# Patient Record
Sex: Male | Born: 1955 | Race: White | Hispanic: No | Marital: Married | Smoking: Never smoker
Health system: Southern US, Community
[De-identification: ages and names within clinical notes are randomized; demographics above are authoritative.]

## PROBLEM LIST (undated history)

## (undated) DIAGNOSIS — E213 Hyperparathyroidism, unspecified: Secondary | ICD-10-CM

## (undated) DIAGNOSIS — L719 Rosacea, unspecified: Secondary | ICD-10-CM

## (undated) DIAGNOSIS — J180 Bronchopneumonia, unspecified organism: Secondary | ICD-10-CM

## (undated) DIAGNOSIS — R972 Elevated prostate specific antigen [PSA]: Secondary | ICD-10-CM

## (undated) DIAGNOSIS — K219 Gastro-esophageal reflux disease without esophagitis: Secondary | ICD-10-CM

## (undated) DIAGNOSIS — M1990 Unspecified osteoarthritis, unspecified site: Secondary | ICD-10-CM

## (undated) DIAGNOSIS — M8580 Other specified disorders of bone density and structure, unspecified site: Secondary | ICD-10-CM

## (undated) DIAGNOSIS — M109 Gout, unspecified: Secondary | ICD-10-CM

## (undated) DIAGNOSIS — E7800 Pure hypercholesterolemia, unspecified: Secondary | ICD-10-CM

## (undated) DIAGNOSIS — J309 Allergic rhinitis, unspecified: Secondary | ICD-10-CM

## (undated) DIAGNOSIS — Z8739 Personal history of other diseases of the musculoskeletal system and connective tissue: Secondary | ICD-10-CM

## (undated) HISTORY — PX: FINGER SURGERY: SHX640

## (undated) HISTORY — DX: Other specified disorders of bone density and structure, unspecified site: M85.80

## (undated) HISTORY — DX: Pure hypercholesterolemia, unspecified: E78.00

## (undated) HISTORY — DX: Hypercalcemia: E83.52

## (undated) HISTORY — DX: Rosacea, unspecified: L71.9

## (undated) HISTORY — DX: Allergic rhinitis, unspecified: J30.9

## (undated) HISTORY — DX: Elevated prostate specific antigen (PSA): R97.20

## (undated) HISTORY — PX: TONSILLECTOMY: SUR1361

## (undated) HISTORY — DX: Gastro-esophageal reflux disease without esophagitis: K21.9

## (undated) HISTORY — PX: ESOPHAGOGASTRODUODENOSCOPY: SHX1529

---

## 2003-09-22 ENCOUNTER — Encounter: Admission: RE | Admit: 2003-09-22 | Discharge: 2003-09-22 | Payer: Self-pay | Admitting: Family Medicine

## 2004-02-23 ENCOUNTER — Encounter: Admission: RE | Admit: 2004-02-23 | Discharge: 2004-02-23 | Payer: Self-pay | Admitting: Family Medicine

## 2008-10-03 ENCOUNTER — Emergency Department (HOSPITAL_COMMUNITY): Admission: EM | Admit: 2008-10-03 | Discharge: 2008-10-03 | Payer: Self-pay | Admitting: Emergency Medicine

## 2012-10-10 HISTORY — PX: COLONOSCOPY W/ BIOPSIES AND POLYPECTOMY: SHX1376

## 2016-01-26 DIAGNOSIS — Z0000 Encounter for general adult medical examination without abnormal findings: Secondary | ICD-10-CM | POA: Diagnosis not present

## 2016-01-26 DIAGNOSIS — F418 Other specified anxiety disorders: Secondary | ICD-10-CM | POA: Diagnosis not present

## 2016-01-26 DIAGNOSIS — E559 Vitamin D deficiency, unspecified: Secondary | ICD-10-CM | POA: Diagnosis not present

## 2016-01-26 DIAGNOSIS — E782 Mixed hyperlipidemia: Secondary | ICD-10-CM | POA: Diagnosis not present

## 2016-01-26 DIAGNOSIS — K219 Gastro-esophageal reflux disease without esophagitis: Secondary | ICD-10-CM | POA: Diagnosis not present

## 2016-01-26 DIAGNOSIS — R972 Elevated prostate specific antigen [PSA]: Secondary | ICD-10-CM | POA: Diagnosis not present

## 2016-03-14 DIAGNOSIS — E559 Vitamin D deficiency, unspecified: Secondary | ICD-10-CM | POA: Diagnosis not present

## 2016-03-14 DIAGNOSIS — M8589 Other specified disorders of bone density and structure, multiple sites: Secondary | ICD-10-CM | POA: Diagnosis not present

## 2016-03-28 DIAGNOSIS — E782 Mixed hyperlipidemia: Secondary | ICD-10-CM | POA: Diagnosis not present

## 2016-04-08 DIAGNOSIS — M109 Gout, unspecified: Secondary | ICD-10-CM | POA: Diagnosis not present

## 2016-05-06 ENCOUNTER — Ambulatory Visit (INDEPENDENT_AMBULATORY_CARE_PROVIDER_SITE_OTHER): Payer: BLUE CROSS/BLUE SHIELD | Admitting: Endocrinology

## 2016-05-06 ENCOUNTER — Encounter: Payer: Self-pay | Admitting: Endocrinology

## 2016-05-06 DIAGNOSIS — E7800 Pure hypercholesterolemia, unspecified: Secondary | ICD-10-CM | POA: Diagnosis not present

## 2016-05-06 DIAGNOSIS — M8580 Other specified disorders of bone density and structure, unspecified site: Secondary | ICD-10-CM

## 2016-05-06 DIAGNOSIS — L719 Rosacea, unspecified: Secondary | ICD-10-CM

## 2016-05-06 DIAGNOSIS — K219 Gastro-esophageal reflux disease without esophagitis: Secondary | ICD-10-CM

## 2016-05-06 DIAGNOSIS — J309 Allergic rhinitis, unspecified: Secondary | ICD-10-CM

## 2016-05-06 DIAGNOSIS — R972 Elevated prostate specific antigen [PSA]: Secondary | ICD-10-CM | POA: Diagnosis not present

## 2016-05-06 LAB — TSH: TSH: 1.6 u[IU]/mL (ref 0.35–4.50)

## 2016-05-06 LAB — MAGNESIUM: Magnesium: 2 mg/dL (ref 1.5–2.5)

## 2016-05-06 LAB — PHOSPHORUS: Phosphorus: 2.7 mg/dL (ref 2.3–4.6)

## 2016-05-06 NOTE — Progress Notes (Signed)
Subjective:    Patient ID: Daniel Wong, male    DOB: 1956-09-14, 60 y.o.   MRN: [id number]  HPI Pt was noted to have moderate hypercalcemia in 2016 (he believes it was it was normal prior to that, but not sure). He has never had urolithiasis, thyroid probs, parathyroid probs, sarcoidosis, cancer, PUD, pancreatitis, recent severe injury, or depression.  He does not take vitamin-A supplements.  Pt has no h/o any of these: [place], or prolonged immobilization.  Pt denies taking antacids, Li++, or HCTZ.   Vit-d deficiency was corrected, but Ca++ level was not significantly changed.  He has had slight fx of a few fingers.  No assoc polyuria.   Past Medical History:  Diagnosis Date  . Allergic rhinitis   . GERD (gastroesophageal reflux disease)   . High cholesterol   . Hypercalcemia   . Osteopenia   . PSA elevation   . Rosacea     No past surgical history on file.  Social History   Social History  . Marital status: Single    Spouse name: N/A  . Number of children: N/A  . Years of education: N/A   Occupational History  . Not on file.   Social History Main Topics  . Smoking status: Never Smoker  . Smokeless tobacco: Never Used  . Alcohol use 0.6 oz/week    1 Glasses of wine per week     Comment: 1 glass per night   . Drug use: Unknown  . Sexual activity: Not on file   Other Topics Concern  . Not on file   Social History Narrative  . No narrative on file    No current outpatient prescriptions on file prior to visit.   No current facility-administered medications on file prior to visit.     Allergies  Allergen Reactions  . Sulfa Antibiotics Hives    Family History  Problem Relation Age of Onset  . Multiple myeloma Mother   . Prostate cancer Father     BP 110/80   Pulse 61   Ht 6' 1"  (1.854 m)   Wt 204 lb (92.5 kg)   SpO2 97%   BMI 26.91 kg/m   Review of Systems denies weight loss, gynecomastia, hematuria, cold intolerance, numbness, arthralgias,  abdominal pain, urinary frequency, skin rash, visual loss, sob, diarrhea, rhinorrhea, and easy bruising.      Objective:   Physical Exam VS: see vs page GEN: no distress HEAD: head: no deformity eyes: no periorbital swelling, no proptosis external nose and ears are normal mouth: no lesion seen NECK: supple, thyroid is not enlarged CHEST WALL: no deformity.  No kyphosis LUNGS: clear to auscultation BREASTS:  No gynecomastia CV: reg rate and rhythm, no murmur ABD: abdomen is soft, nontender.  no hepatosplenomegaly.  not distended.  no hernia MUSCULOSKELETAL: muscle bulk and strength are grossly normal.  no obvious joint swelling.  gait is normal and steady EXTEMITIES: no deformity.  no ulcer on the feet.  feet are of normal color and temp.  no edema PULSES: dorsalis pedis intact bilat.  no carotid bruit NEURO:  cn 2-12 grossly intact.   readily moves all 4's.  sensation is intact to touch on the feet SKIN:  Normal texture and temperature.  No rash or suspicious lesion is visible.   NODES:  None palpable at the neck PSYCH: alert, well-oriented.  Does not appear anxious nor depressed.  I have reviewed outside records, and summarized: Pt was followed over 2016 and 2017  for hypercalcemia.  Vit-D deficiency responded to rx.  No cause of hypercalcemia was found.   outside test results are reviewed: Ca++=11.2, 10.7, and 11.1 PTH=65, 43, 42, and 37 AP=81 25-OH vit-D (after replacement)=45  DEXA: T-score is 0.5 and -1.2    Assessment & Plan:  Hypercalcemia, new, uncertain etiology.    Patient is advised the following: Patient Instructions  blood tests and a 24-HR urine test are requested for you today.  We'll let you know about the results.   Renato Shin, MD

## 2016-05-06 NOTE — Patient Instructions (Signed)
blood tests (and a 24-HR urine test) are requested for you today.  We'll let you know about the results.   

## 2016-05-08 ENCOUNTER — Encounter: Payer: Self-pay | Admitting: Endocrinology

## 2016-05-09 NOTE — Addendum Note (Signed)
Addended by: Adline Mango I on: 05/09/2016 11:40 AM   Modules accepted: Orders

## 2016-05-10 LAB — CALCIUM, URINE, 24 HOUR
Calcium, 24 hour urine: 182 mg/24 h (ref 55–300)
Calcium, Ur: 6 mg/dL

## 2016-05-11 LAB — PROTEIN ELECTROPHORESIS, SERUM
Albumin ELP: 4 g/dL (ref 3.8–4.8)
Alpha-1-Globulin: 0.3 g/dL (ref 0.2–0.3)
Alpha-2-Globulin: 0.6 g/dL (ref 0.5–0.9)
Beta 2: 0.3 g/dL (ref 0.2–0.5)
Beta Globulin: 0.4 g/dL (ref 0.4–0.6)
Gamma Globulin: 0.8 g/dL (ref 0.8–1.7)
Total Protein, Serum Electrophoresis: 6.3 g/dL (ref 6.1–8.1)

## 2016-05-11 LAB — VITAMIN D 1,25 DIHYDROXY
Vitamin D 1, 25 (OH)2 Total: 57 pg/mL (ref 18–72)
Vitamin D2 1, 25 (OH)2: <8 pg/mL
Vitamin D3 1, 25 (OH)2: 57 pg/mL

## 2016-05-11 LAB — VITAMIN A: Vitamin A (Retinoic Acid): 64 ug/dL (ref 38–98)

## 2016-05-12 LAB — PTH-RELATED PEPTIDE: PTH-Related Protein (PTH-RP): 15 pg/mL (ref 14–27)

## 2016-07-01 DIAGNOSIS — Z23 Encounter for immunization: Secondary | ICD-10-CM | POA: Diagnosis not present

## 2016-10-24 DIAGNOSIS — J069 Acute upper respiratory infection, unspecified: Secondary | ICD-10-CM | POA: Diagnosis not present

## 2017-02-06 DIAGNOSIS — M21962 Unspecified acquired deformity of left lower leg: Secondary | ICD-10-CM | POA: Diagnosis not present

## 2017-02-06 DIAGNOSIS — M21611 Bunion of right foot: Secondary | ICD-10-CM | POA: Diagnosis not present

## 2017-02-06 DIAGNOSIS — M2041 Other hammer toe(s) (acquired), right foot: Secondary | ICD-10-CM | POA: Diagnosis not present

## 2017-02-06 DIAGNOSIS — M21961 Unspecified acquired deformity of right lower leg: Secondary | ICD-10-CM | POA: Diagnosis not present

## 2017-02-06 DIAGNOSIS — M2042 Other hammer toe(s) (acquired), left foot: Secondary | ICD-10-CM | POA: Diagnosis not present

## 2017-02-06 DIAGNOSIS — M21612 Bunion of left foot: Secondary | ICD-10-CM | POA: Diagnosis not present

## 2017-03-27 DIAGNOSIS — Z125 Encounter for screening for malignant neoplasm of prostate: Secondary | ICD-10-CM | POA: Diagnosis not present

## 2017-03-27 DIAGNOSIS — Z1159 Encounter for screening for other viral diseases: Secondary | ICD-10-CM | POA: Diagnosis not present

## 2017-03-27 DIAGNOSIS — M109 Gout, unspecified: Secondary | ICD-10-CM | POA: Diagnosis not present

## 2017-03-27 DIAGNOSIS — E782 Mixed hyperlipidemia: Secondary | ICD-10-CM | POA: Diagnosis not present

## 2017-03-27 DIAGNOSIS — E559 Vitamin D deficiency, unspecified: Secondary | ICD-10-CM | POA: Diagnosis not present

## 2017-03-27 DIAGNOSIS — Z0000 Encounter for general adult medical examination without abnormal findings: Secondary | ICD-10-CM | POA: Diagnosis not present

## 2017-07-05 DIAGNOSIS — Z23 Encounter for immunization: Secondary | ICD-10-CM | POA: Diagnosis not present

## 2017-09-04 DIAGNOSIS — H04121 Dry eye syndrome of right lacrimal gland: Secondary | ICD-10-CM | POA: Diagnosis not present

## 2018-01-02 DIAGNOSIS — J069 Acute upper respiratory infection, unspecified: Secondary | ICD-10-CM | POA: Diagnosis not present

## 2018-01-02 DIAGNOSIS — J040 Acute laryngitis: Secondary | ICD-10-CM | POA: Diagnosis not present

## 2018-02-22 DIAGNOSIS — M25511 Pain in right shoulder: Secondary | ICD-10-CM | POA: Diagnosis not present

## 2018-04-09 DIAGNOSIS — Z0000 Encounter for general adult medical examination without abnormal findings: Secondary | ICD-10-CM | POA: Diagnosis not present

## 2018-04-09 DIAGNOSIS — Z125 Encounter for screening for malignant neoplasm of prostate: Secondary | ICD-10-CM | POA: Diagnosis not present

## 2018-04-09 DIAGNOSIS — R351 Nocturia: Secondary | ICD-10-CM | POA: Diagnosis not present

## 2018-04-09 DIAGNOSIS — R972 Elevated prostate specific antigen [PSA]: Secondary | ICD-10-CM | POA: Diagnosis not present

## 2018-04-09 DIAGNOSIS — E559 Vitamin D deficiency, unspecified: Secondary | ICD-10-CM | POA: Diagnosis not present

## 2018-04-09 DIAGNOSIS — E782 Mixed hyperlipidemia: Secondary | ICD-10-CM | POA: Diagnosis not present

## 2018-05-02 ENCOUNTER — Other Ambulatory Visit: Payer: Self-pay

## 2018-05-02 ENCOUNTER — Encounter (HOSPITAL_COMMUNITY): Payer: Self-pay | Admitting: *Deleted

## 2018-05-02 ENCOUNTER — Inpatient Hospital Stay (HOSPITAL_COMMUNITY): Payer: BLUE CROSS/BLUE SHIELD

## 2018-05-02 ENCOUNTER — Encounter (HOSPITAL_COMMUNITY)
Admission: EM | Disposition: A | Discharge status: Home / Self Care | Payer: Self-pay | Source: Home / Self Care | Attending: Emergency Medicine

## 2018-05-02 ENCOUNTER — Observation Stay (HOSPITAL_COMMUNITY)
Admission: EM | Admit: 2018-05-02 | Discharge: 2018-05-03 | Disposition: A | Discharge status: Home / Self Care | Payer: BLUE CROSS/BLUE SHIELD | Attending: Cardiovascular Disease | Admitting: Cardiovascular Disease

## 2018-05-02 DIAGNOSIS — E7800 Pure hypercholesterolemia, unspecified: Secondary | ICD-10-CM | POA: Insufficient documentation

## 2018-05-02 DIAGNOSIS — Z79899 Other long term (current) drug therapy: Secondary | ICD-10-CM | POA: Diagnosis not present

## 2018-05-02 DIAGNOSIS — Z9889 Other specified postprocedural states: Secondary | ICD-10-CM | POA: Insufficient documentation

## 2018-05-02 DIAGNOSIS — I061 Rheumatic aortic insufficiency: Secondary | ICD-10-CM | POA: Diagnosis not present

## 2018-05-02 DIAGNOSIS — Z7982 Long term (current) use of aspirin: Secondary | ICD-10-CM | POA: Insufficient documentation

## 2018-05-02 DIAGNOSIS — M8580 Other specified disorders of bone density and structure, unspecified site: Secondary | ICD-10-CM | POA: Insufficient documentation

## 2018-05-02 DIAGNOSIS — K219 Gastro-esophageal reflux disease without esophagitis: Secondary | ICD-10-CM | POA: Diagnosis not present

## 2018-05-02 DIAGNOSIS — R9431 Abnormal electrocardiogram [ECG] [EKG]: Secondary | ICD-10-CM | POA: Diagnosis not present

## 2018-05-02 DIAGNOSIS — I25119 Atherosclerotic heart disease of native coronary artery with unspecified angina pectoris: Secondary | ICD-10-CM | POA: Diagnosis not present

## 2018-05-02 DIAGNOSIS — I249 Acute ischemic heart disease, unspecified: Secondary | ICD-10-CM

## 2018-05-02 DIAGNOSIS — R079 Chest pain, unspecified: Secondary | ICD-10-CM | POA: Diagnosis present

## 2018-05-02 DIAGNOSIS — Z882 Allergy status to sulfonamides status: Secondary | ICD-10-CM | POA: Diagnosis not present

## 2018-05-02 DIAGNOSIS — R0789 Other chest pain: Secondary | ICD-10-CM | POA: Diagnosis not present

## 2018-05-02 DIAGNOSIS — R072 Precordial pain: Secondary | ICD-10-CM

## 2018-05-02 HISTORY — DX: Personal history of other diseases of the musculoskeletal system and connective tissue: Z87.39

## 2018-05-02 HISTORY — DX: Unspecified osteoarthritis, unspecified site: M19.90

## 2018-05-02 HISTORY — PX: CARDIAC CATHETERIZATION: SHX172

## 2018-05-02 HISTORY — PX: LEFT HEART CATH AND CORONARY ANGIOGRAPHY: CATH118249

## 2018-05-02 HISTORY — DX: Bronchopneumonia, unspecified organism: J18.0

## 2018-05-02 LAB — LIPID PANEL
CHOLESTEROL: 154 mg/dL (ref 0–200)
HDL: 52 mg/dL (ref >40)
LDL Cholesterol: 87 mg/dL (ref 0–99)
Total CHOL/HDL Ratio: 3 RATIO
Triglycerides: 75 mg/dL (ref <150)
VLDL: 15 mg/dL (ref 0–40)

## 2018-05-02 LAB — CBC
HCT: 47.2 % (ref 39.0–52.0)
HEMATOCRIT: 39.9 % (ref 39.0–52.0)
HEMOGLOBIN: 13.2 g/dL (ref 13.0–17.0)
Hemoglobin: 15.1 g/dL (ref 13.0–17.0)
MCH: 30 pg (ref 26.0–34.0)
MCH: 29.9 pg (ref 26.0–34.0)
MCHC: 32 g/dL (ref 30.0–36.0)
MCHC: 33.1 g/dL (ref 30.0–36.0)
MCV: 93.7 fL (ref 78.0–100.0)
MCV: 90.3 fL (ref 78.0–100.0)
Platelets: 240 10*3/uL (ref 150–400)
Platelets: 216 10*3/uL (ref 150–400)
RBC: 5.04 MIL/uL (ref 4.22–5.81)
RBC: 4.42 MIL/uL (ref 4.22–5.81)
RDW: 12.5 % (ref 11.5–15.5)
RDW: 12.4 % (ref 11.5–15.5)
WBC: 10.6 10*3/uL — ABNORMAL HIGH (ref 4.0–10.5)
WBC: 12.3 10*3/uL — AB (ref 4.0–10.5)

## 2018-05-02 LAB — BASIC METABOLIC PANEL
Anion gap: 6 (ref 5–15)
BUN: 12 mg/dL (ref 8–23)
CO2: 27 mmol/L (ref 22–32)
Calcium: 10.7 mg/dL — ABNORMAL HIGH (ref 8.9–10.3)
Chloride: 107 mmol/L (ref 98–111)
Creatinine, Ser: 1 mg/dL (ref 0.61–1.24)
GFR calc Af Amer: >60 mL/min (ref >60)
GFR calc non Af Amer: >60 mL/min (ref >60)
Glucose, Bld: 179 mg/dL — ABNORMAL HIGH (ref 70–99)
Potassium: 4.1 mmol/L (ref 3.5–5.1)
Sodium: 140 mmol/L (ref 135–145)

## 2018-05-02 LAB — COMPREHENSIVE METABOLIC PANEL
ALT: 34 U/L (ref 0–44)
ANION GAP: 6 (ref 5–15)
AST: 31 U/L (ref 15–41)
Albumin: 3.5 g/dL (ref 3.5–5.0)
Alkaline Phosphatase: 72 U/L (ref 38–126)
BUN: 11 mg/dL (ref 8–23)
CO2: 25 mmol/L (ref 22–32)
CREATININE: 0.93 mg/dL (ref 0.61–1.24)
Calcium: 9.9 mg/dL (ref 8.9–10.3)
Chloride: 107 mmol/L (ref 98–111)
GFR calc non Af Amer: >60 mL/min (ref >60)
Glucose, Bld: 136 mg/dL — ABNORMAL HIGH (ref 70–99)
POTASSIUM: 3.9 mmol/L (ref 3.5–5.1)
Sodium: 138 mmol/L (ref 135–145)
TOTAL PROTEIN: 5.6 g/dL — AB (ref 6.5–8.1)
Total Bilirubin: 0.6 mg/dL (ref 0.3–1.2)

## 2018-05-02 LAB — TROPONIN I: Troponin I: <0.03 ng/mL (ref <0.03)

## 2018-05-02 LAB — APTT

## 2018-05-02 LAB — SEDIMENTATION RATE: SED RATE: 3 mm/h (ref 0–16)

## 2018-05-02 LAB — HEMOGLOBIN A1C
Hgb A1c MFr Bld: 5.3 % (ref 4.8–5.6)
Mean Plasma Glucose: 105.41 mg/dL

## 2018-05-02 LAB — I-STAT TROPONIN, ED: Troponin i, poc: 0 ng/mL (ref 0.00–0.08)

## 2018-05-02 LAB — PROTIME-INR
INR: 1.22
Prothrombin Time: 15.3 seconds — ABNORMAL HIGH (ref 11.4–15.2)

## 2018-05-02 SURGERY — LEFT HEART CATH AND CORONARY ANGIOGRAPHY
Anesthesia: LOCAL

## 2018-05-02 MED ORDER — SODIUM CHLORIDE 0.9% FLUSH
3.0000 mL | Freq: Two times a day (BID) | INTRAVENOUS | Status: DC
  Administered 2018-05-03: 10:00:00 3 mL via INTRAVENOUS

## 2018-05-02 MED ORDER — ATORVASTATIN CALCIUM 40 MG PO TABS
40.0000 mg | ORAL_TABLET | Freq: Every day | ORAL | Status: DC
  Administered 2018-05-02: 18:00:00 40 mg via ORAL
  Filled 2018-05-02: qty 1

## 2018-05-02 MED ORDER — FENTANYL CITRATE (PF) 100 MCG/2ML IJ SOLN
INTRAMUSCULAR | Status: AC
  Filled 2018-05-02: qty 2

## 2018-05-02 MED ORDER — HEPARIN (PORCINE) IN NACL 1000-0.9 UT/500ML-% IV SOLN
INTRAVENOUS | Status: DC | PRN
  Administered 2018-05-02: 500 mL

## 2018-05-02 MED ORDER — FENTANYL CITRATE (PF) 100 MCG/2ML IJ SOLN
INTRAMUSCULAR | Status: DC | PRN
  Administered 2018-05-02 (×2): 25 ug via INTRAVENOUS

## 2018-05-02 MED ORDER — HEPARIN (PORCINE) IN NACL 1000-0.9 UT/500ML-% IV SOLN
INTRAVENOUS | Status: DC | PRN
  Administered 2018-05-02 (×2): 500 mL

## 2018-05-02 MED ORDER — MIDAZOLAM HCL 2 MG/2ML IJ SOLN
INTRAMUSCULAR | Status: DC | PRN
  Administered 2018-05-02: 2 mg via INTRAVENOUS
  Administered 2018-05-02: 1 mg via INTRAVENOUS

## 2018-05-02 MED ORDER — SODIUM CHLORIDE 0.9 % IV SOLN: 250.0000 mL | INTRAVENOUS | Status: DC | PRN

## 2018-05-02 MED ORDER — IOPAMIDOL (ISOVUE-370) INJECTION 76%
INTRAVENOUS | Status: DC | PRN
  Administered 2018-05-02: 90 mL via INTRA_ARTERIAL

## 2018-05-02 MED ORDER — VERAPAMIL HCL 2.5 MG/ML IV SOLN
INTRAVENOUS | Status: DC | PRN
  Administered 2018-05-02: 10 mL via INTRA_ARTERIAL

## 2018-05-02 MED ORDER — LIDOCAINE HCL (PF) 1 % IJ SOLN
INTRAMUSCULAR | Status: AC
  Filled 2018-05-02: qty 30

## 2018-05-02 MED ORDER — HEPARIN SODIUM (PORCINE) 1000 UNIT/ML IJ SOLN
INTRAMUSCULAR | Status: DC | PRN
  Administered 2018-05-02: 4000 [IU] via INTRAVENOUS

## 2018-05-02 MED ORDER — SODIUM CHLORIDE 0.9 % IV SOLN
INTRAVENOUS | Status: DC
  Administered 2018-05-02: 17:00:00 via INTRAVENOUS

## 2018-05-02 MED ORDER — HEPARIN SODIUM (PORCINE) 1000 UNIT/ML IJ SOLN
INTRAMUSCULAR | Status: AC
Start: 2018-05-02 — End: ?
  Filled 2018-05-02: qty 1

## 2018-05-02 MED ORDER — MIDAZOLAM HCL 2 MG/2ML IJ SOLN
INTRAMUSCULAR | Status: AC
  Filled 2018-05-02: qty 2

## 2018-05-02 MED ORDER — COLCHICINE 0.6 MG PO TABS: 0.6000 mg | ORAL_TABLET | Freq: Two times a day (BID) | ORAL | Status: DC | PRN

## 2018-05-02 MED ORDER — ACETAMINOPHEN 325 MG PO TABS: 650.0000 mg | ORAL_TABLET | ORAL | Status: DC | PRN

## 2018-05-02 MED ORDER — ASPIRIN 81 MG PO CHEW
CHEWABLE_TABLET | ORAL | Status: DC | PRN
  Administered 2018-05-02: 162 mg via ORAL

## 2018-05-02 MED ORDER — PANTOPRAZOLE SODIUM 20 MG PO TBEC
20.0000 mg | DELAYED_RELEASE_TABLET | Freq: Two times a day (BID) | ORAL | Status: DC
Start: 2018-05-02 — End: 2018-05-03
  Filled 2018-05-02: qty 1

## 2018-05-02 MED ORDER — HEPARIN (PORCINE) IN NACL 1000-0.9 UT/500ML-% IV SOLN
INTRAVENOUS | Status: AC
  Filled 2018-05-02: qty 1000

## 2018-05-02 MED ORDER — SODIUM CHLORIDE 0.9% FLUSH: 3.0000 mL | INTRAVENOUS | Status: DC | PRN

## 2018-05-02 MED ORDER — ZOLPIDEM TARTRATE 5 MG PO TABS: 5.0000 mg | ORAL_TABLET | Freq: Every evening | ORAL | Status: DC | PRN

## 2018-05-02 MED ORDER — LIDOCAINE HCL (PF) 1 % IJ SOLN
INTRAMUSCULAR | Status: DC | PRN
  Administered 2018-05-02: 2 mL via SUBCUTANEOUS

## 2018-05-02 MED ORDER — ASPIRIN 81 MG PO CHEW
81.0000 mg | CHEWABLE_TABLET | Freq: Every day | ORAL | Status: DC
  Administered 2018-05-03: 10:00:00 81 mg via ORAL
  Filled 2018-05-02: qty 1

## 2018-05-02 MED ORDER — IOPAMIDOL (ISOVUE-370) INJECTION 76%
INTRAVENOUS | Status: AC
  Filled 2018-05-02: qty 125

## 2018-05-02 MED ORDER — VERAPAMIL HCL 2.5 MG/ML IV SOLN
INTRAVENOUS | Status: AC
  Filled 2018-05-02: qty 2

## 2018-05-02 MED ORDER — DIAZEPAM 5 MG PO TABS: 5.0000 mg | ORAL_TABLET | Freq: Four times a day (QID) | ORAL | Status: DC | PRN

## 2018-05-02 MED ORDER — ONDANSETRON HCL 4 MG/2ML IJ SOLN
4.0000 mg | Freq: Four times a day (QID) | INTRAMUSCULAR | Status: DC | PRN
Start: 2018-05-02 — End: 2018-05-03

## 2018-05-02 SURGICAL SUPPLY — 12 items
CATH INFINITI 5FR ANG PIGTAIL (CATHETERS) ×2 IMPLANT
CATH OPTITORQUE TIG 4.0 5F (CATHETERS) ×2 IMPLANT
DEVICE RAD COMP TR BAND LRG (VASCULAR PRODUCTS) ×2 IMPLANT
GLIDESHEATH SLEND SS 6F .021 (SHEATH) ×2 IMPLANT
GUIDEWIRE INQWIRE 1.5J.035X260 (WIRE) ×1 IMPLANT
INQWIRE 1.5J .035X260CM (WIRE) ×2
KIT ENCORE 26 ADVANTAGE (KITS) IMPLANT
KIT HEART LEFT (KITS) ×2 IMPLANT
PACK CARDIAC CATHETERIZATION (CUSTOM PROCEDURE TRAY) ×2 IMPLANT
SYR MEDRAD MARK V 150ML (SYRINGE) ×2 IMPLANT
TRANSDUCER W/STOPCOCK (MISCELLANEOUS) ×2 IMPLANT
TUBING CIL FLEX 10 FLL-RA (TUBING) ×2 IMPLANT

## 2018-05-02 NOTE — ED Triage Notes (Signed)
Pt in c/o chest pain that started this morning, worse with taking a deep breath, denies n/v, no distress noted

## 2018-05-02 NOTE — ED Provider Notes (Signed)
Emergency Department Provider Note   I have reviewed the triage vital signs and the nursing notes.   HISTORY  Chief Complaint Chest Pain   HPI Daniel Wong is a 62 y.o. male with chest pain.  Patient states that he has had worsening chest tightness earlier today.  Did not get better with his normal reflux medication.  He has no nausea, vomiting, shortness of breath, diaphoresis.  No cough.  Does not radiate.  No other associated symptoms.  Never had this before.  No family history of the same. No other associated or modifying symptoms.    Past Medical History:  Diagnosis Date  . Allergic rhinitis   . GERD (gastroesophageal reflux disease)   . High cholesterol   . Hypercalcemia   . Osteopenia   . PSA elevation   . Rosacea     Patient Active Problem List   Diagnosis Date Noted  . Chest pain at rest 05/02/2018  . ACS (acute coronary syndrome) ([place])   . Hypercalcemia 05/06/2016  . Rosacea   . High cholesterol   . PSA elevation   . Allergic rhinitis   . GERD (gastroesophageal reflux disease)   . Osteopenia     History reviewed. No pertinent surgical history.    Allergies Sulfa antibiotics  Family History  Problem Relation Age of Onset  . Multiple myeloma Mother   . Prostate cancer Father   . Hypercalcemia Neg Hx     Social History Social History   Tobacco Use  . Smoking status: Never Smoker  . Smokeless tobacco: Never Used  Substance Use Topics  . Alcohol use: Yes    Alcohol/week: 0.6 oz    Types: 1 Glasses of wine per week    Comment: 1 glass per night   . Drug use: Not on file    Review of Systems  All other systems negative except as documented in the HPI. All pertinent positives and negatives as reviewed in the HPI. ____________________________________________   PHYSICAL EXAM:  VITAL SIGNS: ED Triage Vitals  Enc Vitals Group     BP 05/02/18 1445 128/78     Pulse Rate 05/02/18 1445 77     Resp 05/02/18 1445 20     Temp 05/02/18  1445 98 F (36.7 C)     Temp Source 05/02/18 1445 Oral     SpO2 05/02/18 1445 100 %    Constitutional: Alert and oriented. Well appearing and in no acute distress. Eyes: Conjunctivae are normal. PERRL. EOMI. Head: Atraumatic. Nose: No congestion/rhinnorhea. Mouth/Throat: Mucous membranes are moist.  Oropharynx non-erythematous. Neck: No stridor.  No meningeal signs.   Cardiovascular: Normal rate, regular rhythm. Good peripheral circulation. Grossly normal heart sounds.   Respiratory: Normal respiratory effort.  No retractions. Lungs CTAB. Gastrointestinal: Soft and nontender. No distention.  Musculoskeletal: No lower extremity tenderness nor edema. No gross deformities of extremities. Neurologic:  Normal speech and language. No gross focal neurologic deficits are appreciated.  Skin:  Skin is warm, dry and intact. No rash noted.  ____________________________________________   LABS (all labs ordered are listed, but only abnormal results are displayed)  Labs Reviewed  BASIC METABOLIC PANEL - Abnormal; Notable for the following components:      Result Value   Glucose, Bld 179 (*)    Calcium 10.7 (*)    All other components within normal limits  CBC - Abnormal; Notable for the following components:   WBC 10.6 (*)    All other components within normal limits  CBC - Abnormal; Notable for the following components:   WBC 12.3 (*)    All other components within normal limits  COMPREHENSIVE METABOLIC PANEL  LIPID PANEL  HEMOGLOBIN A1C  PROTIME-INR  APTT  TROPONIN I  SEDIMENTATION RATE  CBC  BASIC METABOLIC PANEL  I-STAT TROPONIN, ED   ____________________________________________  EKG   EKG Interpretation  Date/Time:  Wednesday May 02 2018 14:46:51 EDT Ventricular Rate:  74 PR Interval:  148 QRS Duration: 94 QT Interval:  390 QTC Calculation: 432 R Axis:   39 Text Interpretation:   Critical Test Result: STEMI Normal sinus rhythm ST elevation consider inferior injury  or acute infarct ** ** ACUTE MI / STEMI ** ** Abnormal ECG No old tracing to compare Confirmed by Jorgen Wolfinger, Corene Cornea [phone]) on 05/02/2018 2:53:55 PM        INITIAL IMPRESSION / ASSESSMENT AND PLAN / ED COURSE  EKG with possible STEMI versus more likely to be benign repolarization.  Try to discuss it with cardiology but I think they saw and took him to the Cath Lab instead.  Was not able to reassess patient.  No other interventions or workup done in the emergency room.  Pertinent labs & imaging results that were available during my care of the patient were reviewed by me and considered in my medical decision making (see chart for details).  ____________________________________________  FINAL CLINICAL IMPRESSION(S) / ED DIAGNOSES  Final diagnoses:  Chest pain, unspecified type     MEDICATIONS GIVEN DURING THIS VISIT:  Medications  acetaminophen (TYLENOL) tablet 650 mg (has no administration in time range)  ondansetron (ZOFRAN) injection 4 mg (has no administration in time range)  0.9 %  sodium chloride infusion ( Intravenous New Bag/Given 05/02/18 1701)  sodium chloride flush (NS) 0.9 % injection 3 mL (has no administration in time range)  sodium chloride flush (NS) 0.9 % injection 3 mL (has no administration in time range)  0.9 %  sodium chloride infusion (has no administration in time range)  diazepam (VALIUM) tablet 5 mg (has no administration in time range)  atorvastatin (LIPITOR) tablet 40 mg (has no administration in time range)  aspirin chewable tablet 81 mg (has no administration in time range)  zolpidem (AMBIEN) tablet 5 mg (has no administration in time range)     NEW OUTPATIENT MEDICATIONS STARTED DURING THIS VISIT:  Current Discharge Medication List      Note:  This note was prepared with assistance of Dragon voice recognition software. Occasional wrong-word or sound-a-like substitutions may have occurred due to the inherent limitations of voice recognition software.    Merrily Pew, MD 05/02/18 1721

## 2018-05-02 NOTE — H&P (Addendum)
Cardiology Admission History and Physical:   Patient ID: JAHI ROZA; MRN: [id number]; DOB: 1956/07/08   Admission date: 05/02/2018  Primary Care Provider: Mayra Neer, MD Primary Cardiologist: Shelva Majestic, MD NEW Primary Electrophysiologist:  NA  Chief Complaint:  Chest pain  Patient Profile:   BILLYJOE GO is a 62 y.o. male with a history of GERD, HLD now presenting with chest pain and acute EKG changes. To cath lab emergently.   History of Present Illness:   Mr. Ewings developed chest pain after BK which included a few cups of coffee this AM, he has hx of GERD and took meds for this and with pain he also took pepcid.  When pain did not improve he took 2 baby ASA,  About 11 AM, he does note pain increases with deep breath and no N/V.     EKG with ST elevation in inf. Leads- pt brought to cath lab for emergent STEMI.  Dr. Claiborne Billings has seen and examined pt.     Past Medical History:  Diagnosis Date  . Allergic rhinitis   . GERD (gastroesophageal reflux disease)   . High cholesterol   . Hypercalcemia   . Osteopenia   . PSA elevation   . Rosacea     History reviewed. No pertinent surgical history.   Medications Prior to Admission: Prior to Admission medications   Medication Sig Start Date End Date Taking? Authorizing Provider  aspirin 81 MG tablet Take 81 mg by mouth daily.    [provider]  atorvastatin (LIPITOR) 20 MG tablet Take 20 mg by mouth daily.    [provider]  Azelaic Acid (FINACEA) 15 % cream Apply 15 % topically as needed. After skin is thoroughly washed and patted dry, gently but thoroughly massage a thin film of azelaic acid cream into the affected area twice daily, in the morning and evening.    [provider]  Cholecalciferol (VITAMIN D-3) 1000 units CAPS Take 5,000 Units by mouth daily.    [provider]  colesevelam (WELCHOL) 625 MG tablet Take 625 mg by mouth 2 (two) times daily with a meal.    [provider]  desonide (DESOWEN) 0.05 % cream Apply 1 application topically as needed.    [provider]  doxylamine, Sleep, (UNISOM) 25 MG tablet Take 25 mg by mouth as needed.    [provider]  loratadine (CLARITIN) 10 MG tablet Take 10 mg by mouth daily.    [provider]  Multiple Vitamins-Minerals (MULTIVITAMIN MEN) TABS Take by mouth 2 (two) times daily.    [provider]  naproxen sodium (ANAPROX) 220 MG tablet Take 220 mg by mouth as needed.    [provider]  Pseudoephedrine-Guaifenesin (CONGESTAC) 60-400 MG TABS Take 60 mg by mouth as needed.    [provider]  RABEprazole (ACIPHEX) 20 MG tablet Take 20 mg by mouth daily.    [provider]     Allergies:    Allergies  Allergen Reactions  . Sulfa Antibiotics Hives    Social History:   Social History   Socioeconomic History  . Marital status: Single    Spouse name: Not on file  . Number of children: Not on file  . Years of education: Not on file  . Highest education level: Not on file  Occupational History  . Not on file  Social Needs  . Financial resource strain: Not on file  . Food insecurity:    Worry: Not on  file    Inability: Not on file  . Transportation needs:    Medical: Not on file    Non-medical: Not on file  Tobacco Use  . Smoking status: Never Smoker  . Smokeless tobacco: Never Used  Substance and Sexual Activity  . Alcohol use: Yes    Alcohol/week: 0.6 oz    Types: 1 Glasses of wine per week    Comment: 1 glass per night   . Drug use: Not on file  . Sexual activity: Not on file  Lifestyle  . Physical activity:    Days per week: Not on file    Minutes per session: Not on file  . Stress: Not on file  Relationships  . Social connections:    Talks on phone: Not on file    Gets together: Not on file    Attends religious service: Not on file    Active member of club or organization: Not on file    Attends meetings of clubs  or organizations: Not on file    Relationship status: Not on file  . Intimate partner violence:    Fear of current or ex partner: Not on file    Emotionally abused: Not on file    Physically abused: Not on file    Forced sexual activity: Not on file  Other Topics Concern  . Not on file  Social History Narrative  . Not on file    Family History:   The patient's family history includes Multiple myeloma in his mother; Prostate cancer in his father. There is no history of Hypercalcemia.    ROS:  Please see the history of present illness.  General:no colds or fevers, no weight changes Skin:no rashes or ulcers HEENT:no blurred vision, no congestion CV:see HPI PUL:see HPI GI:no diarrhea constipation or melena, + indigestion GU:no hematuria, no dysuria MS:no joint pain, no claudication Neuro:no syncope, no lightheadedness Endo:no diabetes, no thyroid disease .     Physical Exam/Data:   Vitals:   05/02/18 1445 05/02/18 1546  BP: 128/78   Pulse: 77   Resp: 20   Temp: 98 F (36.7 C)   TempSrc: Oral   SpO2: 100% 100%   No intake or output data in the 24 hours ending 05/02/18 1549 There were no vitals filed for this visit. There is no height or weight on file to calculate BMI.  Per Dr. Claiborne Billings  General:  Well nourished, well developed, in no acute distress HEENT: normal Lymph: no adenopathy Neck: no JVD Endocrine:  No thryomegaly Vascular: No carotid bruits; pedal pulses 2+ bilaterally without bruits  Cardiac:  normal S1, S2; RRR; no murmur gallup rub or click Lungs:  clear to auscultation bilaterally, no wheezing, rhonchi or rales  Abd: soft, nontender, no hepatomegaly  Ext: no edema Musculoskeletal:  No deformities, BUE and BLE strength normal and equal Skin: warm and dry  Neuro:  Alert and oriented X 3 MAE, no focal abnormalities noted Psych:  Normal affect    EKG:  The ECG that was done SR with inf ST elevation was personally reviewed   Relevant CV Studies: Cath   pending  Laboratory Data:  Chemistry Recent Labs  Lab 05/02/18 1449  NA 140  K 4.1  CL 107  CO2 27  GLUCOSE 179*  BUN 12  CREATININE 1.00  CALCIUM 10.7*  GFRNONAA >60  GFRAA >60  ANIONGAP 6    No results for input(s): PROT, ALBUMIN, AST, ALT, ALKPHOS, BILITOT in the last 168 hours. Hematology  Recent Labs  Lab 05/02/18 1449  WBC 10.6*  RBC 5.04  HGB 15.1  HCT 47.2  MCV 93.7  MCH 30.0  MCHC 32.0  RDW 12.5  PLT 240   Cardiac EnzymesNo results for input(s): TROPONINI in the last 168 hours.  Recent Labs  Lab 05/02/18 1500  TROPIPOC 0.00    BNPNo results for input(s): BNP, PROBNP in the last 168 hours.  DDimer No results for input(s): DDIMER in the last 168 hours.  Radiology/Studies:  No results found.  Assessment and Plan:   1. Chest pain with abnormal EKG and STEMI to cath lab  Severity of Illness: The appropriate patient status for this patient is INPATIENT. Inpatient status is judged to be reasonable and necessary in order to provide the required intensity of service to ensure the patient's safety. The patient's presenting symptoms, physical exam findings, and initial radiographic and laboratory data in the context of their chronic comorbidities is felt to place them at high risk for further clinical deterioration. Furthermore, it is not anticipated that the patient will be medically stable for discharge from the hospital within 2 midnights of admission. The following factors support the patient status of inpatient.   " The patient's presenting symptoms include chest pain. " The worrisome physical exam findings include abnormal EKG. " The initial radiographic and laboratory data are worrisome because of abnormality " The chronic co-morbidities include none.   * I certify that at the point of admission it is my clinical judgment that the patient will require inpatient hospital care spanning beyond 2 midnights from the point of admission due to high intensity  of service, high risk for further deterioration and high frequency of surveillance required.*    For questions or updates, please contact [place] Please consult [email or web address] for contact info under Cardiology/STEMI.    Signed, Cecilie Kicks, NP  05/02/2018 3:49 PM   Patient seen and examined. Agree with assessment and plan.  Mr. Antavion Bartoszek is a 62 year old gentleman without known significant cardiac history.  He has a history of hyperlipidemia and has been on atorvastatin 40 mg addition to WelChol.  He also has a history of GERD.  He had never experienced prior chest pain before.  Today [place] 830 this morning he began to notice a sensation of chest pressure.  His symptoms persisted throughout the day.  Ultimately his chest pain seemed to become more intense leading to his emergency room presentation.  While in triage, his initial ECG showed mild J-point elevation inferiorly with suggestion of subtle downsloping PR segment.  A subsequent ECG was concerning for possible early acute inferior injury with 1 mm of inferior ST elevation.  He is brought emergently to the catheterization laboratory for evaluation.  Of note, the patient upon arrival to the Cath Lab still had residual 4 out of 10 chest pain but appeared comfortable.  He had taken 2 baby aspirin earlier today prior to arrival to the emergency room.  I discussed EKG findings with the patient and his wife.  Due to concern for possible early acute inferior injury, plan emergent cardiac catheterization.   Troy Sine, MD, [facility] 05/02/2018 6:24 PM

## 2018-05-02 NOTE — Progress Notes (Signed)
[facility] BAND REMOVAL  LOCATION:    right radial  DEFLATED PER PROTOCOL:    Yes.    TIME BAND OFF / DRESSING APPLIED:    1900    SITE UPON ARRIVAL:    Level 0  SITE AFTER BAND REMOVAL:    Level 0  CIRCULATION SENSATION AND MOVEMENT:    Within Normal Limits   Yes.    COMMENTS:

## 2018-05-02 NOTE — ED Notes (Signed)
Paged CARDS Per Dr Clayborne DanaMesner

## 2018-05-02 NOTE — ED Provider Notes (Addendum)
Patient placed in Quick Look pathway, seen and evaluated   Chief Complaint: chest pain  HPI:   Daniel Wong is a 62 y.o. male who presents to the ED with chest pain that started this morning. Patient reports he got up this morning ate breakfast and drank a few cups of coffee and the pain started after that. Patient took his regular medication for acid reflux this morning and then when pain started to took Pepcid thinking it was heartburn. Then when it didn't stop he took 2 baby ASA about 11 am.  The pain increases with deep breath. Patient denies n/v.   ROS: CV: chest pain  Physical Exam:  BP 128/78 (BP Location: Right Arm)   Pulse 77   Temp 98 F (36.7 C) (Oral)   Resp 20   SpO2 100%    Gen: No distress  Neuro: Awake and Alert  Skin: Warm and dry  Resp: no distress, normal effort  Heart: regular rate and rhythm  Lungs: without wheezing or rales   Dr. Clayborne DanaMesner in to examine the patient and discuss EKG results and will consult with Cardiology.   Initiation of care has begun. The patient has been counseled on the process, plan, and necessity for staying for the completion/evaluation, and the remainder of the medical screening examination    [name], [name], NP 05/02/18 1459    [name], [name], NP 05/02/18 1459    Mesner, Barbara CowerJason, MD 05/02/18 1721

## 2018-05-02 NOTE — ED Notes (Signed)
Cath lab 9 

## 2018-05-03 ENCOUNTER — Encounter (HOSPITAL_COMMUNITY): Payer: Self-pay | Admitting: Cardiovascular Disease

## 2018-05-03 ENCOUNTER — Inpatient Hospital Stay (HOSPITAL_BASED_OUTPATIENT_CLINIC_OR_DEPARTMENT_OTHER): Payer: BLUE CROSS/BLUE SHIELD

## 2018-05-03 DIAGNOSIS — R072 Precordial pain: Secondary | ICD-10-CM

## 2018-05-03 DIAGNOSIS — I351 Nonrheumatic aortic (valve) insufficiency: Secondary | ICD-10-CM

## 2018-05-03 DIAGNOSIS — R079 Chest pain, unspecified: Secondary | ICD-10-CM | POA: Diagnosis present

## 2018-05-03 LAB — BASIC METABOLIC PANEL
ANION GAP: 6 (ref 5–15)
BUN: 9 mg/dL (ref 8–23)
CALCIUM: 10.4 mg/dL — AB (ref 8.9–10.3)
CO2: 25 mmol/L (ref 22–32)
Chloride: 110 mmol/L (ref 98–111)
Creatinine, Ser: 0.95 mg/dL (ref 0.61–1.24)
GLUCOSE: 101 mg/dL — AB (ref 70–99)
Potassium: 4 mmol/L (ref 3.5–5.1)
Sodium: 141 mmol/L (ref 135–145)

## 2018-05-03 LAB — CBC
HCT: 42 % (ref 39.0–52.0)
Hemoglobin: 13.8 g/dL (ref 13.0–17.0)
MCH: 30.1 pg (ref 26.0–34.0)
MCHC: 32.9 g/dL (ref 30.0–36.0)
MCV: 91.5 fL (ref 78.0–100.0)
PLATELETS: 217 10*3/uL (ref 150–400)
RBC: 4.59 MIL/uL (ref 4.22–5.81)
RDW: 12.6 % (ref 11.5–15.5)
WBC: 8.3 10*3/uL (ref 4.0–10.5)

## 2018-05-03 LAB — ECHOCARDIOGRAM COMPLETE: Weight: 3124.8 oz

## 2018-05-03 NOTE — Discharge Summary (Signed)
Discharge Summary    Patient ID: Daniel Wong,  MRN: [id number], DOB/AGE: 1956-05-13 62 y.o.  Admit date: 05/02/2018 Discharge date: 05/03/2018  Primary Care Provider: Lupita Raider Primary Cardiologist: Dr. Tresa Endo   Discharge Diagnoses    Active Problems:   ACS (acute coronary syndrome) [facility])   Chest pain at rest   Chest pain   Precordial chest pain   Allergies Allergies  Allergen Reactions  . Sulfa Antibiotics Hives    Diagnostic Studies/Procedures    Cath: 05/02/18   Prox RCA to Mid RCA lesion is 25% stenosed.   Mild nonobstructive coronary artery disease with smooth 20 to 30% eccentric narrowing in the proximal to mid RCA and a dominant RCA system.  Normal left coronary system.  Normal LV function without focal segmental wall motion abnormalities.  LVEDP 11 mm.  RECOMMENDATION: Medical therapy.  The patient will undergo a 2D echo Doppler study to assess for possible pericardial abnormality.  At the completion of the procedure the patient appeared comfortable but still had mild residual 3 out of 10 chest pain he describes seems to be worse with taking a deep breath.  A subsequent ECG showed improvement in his J-point elevation and was interpreted as possible early repolarization changes.  Recommend Aspirin 81mg  daily for moderate CAD.  The patient will continue with high potency statin therapy and antireflux therapy. _____________   History of Present Illness     62 yo male with hx of GERD, and HL who developed chest pain after breakfast which included a few cups of coffee that AM. He has hx of GERD and took meds for this and with pain he also took pepcid.  When pain did not improve, he took 2 baby ASA about 11 AM. He did note pain increased with deep breath and no N/V.     EKG with ST elevation in inf. Leads, and he was brought to cath lab for emergent STEMI.    Hospital Course   Underwent cardiac cath noted above with minimal disease noted in the  RCA of 25%. His chest pain had essentially resolved at the end of the case. He was admitted overnight with plans for echo the following morning. Post cath labs were stable. No recurrence of chest pain. Follow up echo reviewed by Dr. Clifton James prior to discharge with official read pending. Walked without any recurrent chest pain.   General: Well developed, well nourished, male appearing in no acute distress. Head: Normocephalic, atraumatic.  Neck: Supple without bruits, JVD. Lungs:  Resp regular and unlabored, CTA. Heart: RRR, S1, S2, no S3, S4, or murmur; no rub. Abdomen: Soft, non-tender, non-distended with normoactive bowel sounds. No hepatomegaly. No rebound/guarding. No obvious abdominal masses. Extremities: No clubbing, cyanosis, edema. Distal pedal pulses are 2+ bilaterally. R radial cath site stable without bruising or hematoma Neuro: Alert and oriented X 3. Moves all extremities spontaneously. Psych: Normal affect.  Daniel Wong was seen by Dr. Clifton James and determined stable for discharge home. Follow up in the office has been arranged. Medications are listed below.   _____________  Discharge Vitals Blood pressure 118/74, pulse (!) 56, temperature 98.1 F (36.7 C), resp. rate 18, weight 195 lb 4.8 oz (88.6 kg), SpO2 98 %.  Filed Weights   05/02/18 1546 05/03/18 0246  Weight: 203 lb 14.8 oz (92.5 kg) 195 lb 4.8 oz (88.6 kg)    Labs & Radiologic Studies    CBC Recent Labs    05/02/18 1622 05/03/18 0232  WBC 12.3* 8.3  HGB 13.2 13.8  HCT 39.9 42.0  MCV 90.3 91.5  PLT 216 217   Basic Metabolic Panel Recent Labs    16/07/9606/24/19 1622 05/03/18 0232  NA 138 141  K 3.9 4.0  CL 107 110  CO2 25 25  GLUCOSE 136* 101*  BUN 11 9  CREATININE 0.93 0.95  CALCIUM 9.9 10.4*   Liver Function Tests Recent Labs    05/02/18 1622  AST 31  ALT 34  ALKPHOS 72  BILITOT 0.6  PROT 5.6*  ALBUMIN 3.5   No results for input(s): LIPASE, AMYLASE in the last 72 hours. Cardiac  Enzymes Recent Labs    05/02/18 1622  TROPONINI <0.03   BNP Invalid input(s): POCBNP D-Dimer No results for input(s): DDIMER in the last 72 hours. Hemoglobin A1C Recent Labs    05/02/18 1622  HGBA1C 5.3   Fasting Lipid Panel Recent Labs    05/02/18 1622  CHOL 154  HDL 52  LDLCALC 87  TRIG 75  CHOLHDL 3.0   Thyroid Function Tests No results for input(s): TSH, T4TOTAL, T3FREE, THYROIDAB in the last 72 hours.  Invalid input(s): FREET3 _____________  Dg Chest 2 View  Result Date: 05/02/2018 CLINICAL DATA:  Chest pain EXAM: CHEST - 2 VIEW COMPARISON:  None. FINDINGS: The heart size and mediastinal contours are within normal limits. Both lungs are clear. The visualized skeletal structures are unremarkable. IMPRESSION: No active cardiopulmonary disease. Electronically Signed   By: Jasmine PangKim  Fujinaga M.D.   On: 05/02/2018 20:50   Disposition   Pt is being discharged home today in good condition.  Follow-up Plans & Appointments    Follow-up Information    Lupita RaiderShaw, Kimberlee, MD Follow up.   Specialty:  Family Medicine Contact information: [address] [place] [address] [phone]        Lennette BihariKelly, Thomas A, MD Follow up.   Specialty:  Cardiology Why:  Office will call you with a follow up appt.  Contact information: [address] [place] [address] [phone]             Discharge Medications     Medication List    TAKE these medications   aspirin 81 MG tablet Take 81 mg by mouth daily.   atorvastatin 40 MG tablet Commonly known as:  LIPITOR Take 40 mg by mouth daily at 6 PM.   colchicine 0.6 MG tablet Take 0.6 mg by mouth 2 (two) times daily as needed for pain. Gout flare up   colesevelam 625 MG tablet Commonly known as:  WELCHOL Take 1,250 mg by mouth at bedtime.   desonide 0.05 % cream Commonly known as:  DESOWEN Apply 1 application topically as needed (rash in ears).   doxylamine (Sleep) 25 MG  tablet Commonly known as:  UNISOM Take 25 mg by mouth at bedtime.   FINACEA 15 % cream Generic drug:  Azelaic Acid Apply 15 % topically as needed. After skin is thoroughly washed and patted dry, gently but thoroughly massage a thin film of azelaic acid cream into the affected area twice daily, in the morning and evening.   loratadine 10 MG tablet Commonly known as:  CLARITIN Take 10 mg by mouth daily.   meclizine 25 MG tablet Commonly known as:  ANTIVERT Take 25 mg by mouth 3 (three) times daily as needed for dizziness.   meloxicam 7.5 MG tablet Commonly known as:  MOBIC Take 7.5 mg by mouth 2 (two) times daily as  needed for pain.   MULTIVITAMIN MEN Tabs Take 1 tablet by mouth daily.   RABEprazole 20 MG tablet Commonly known as:  ACIPHEX Take 20 mg by mouth daily.   Vitamin D-3 1000 units Caps Take 5,000 Units by mouth daily.        Acute coronary syndrome (MI, NSTEMI, STEMI, etc) this admission?: No.     Outstanding Labs/Studies   N/a   Duration of Discharge Encounter   Greater than 30 minutes including physician time.  Signed, Laverda Page NP-C 05/03/2018, 12:47 PM  I have personally seen and examined this patient. I agree with the assessment and plan as outlined above.  Mild CAD by cath yesterday. Echo to exclude pericardial effusion this am. By my read there is no effusion and his LV function is normal. He feels better this am. Chest pain resolved. He will be discharged home today. Follow up DR. Tresa Endo.   Verne Carrow 05/03/2018 12:47 PM

## 2018-05-03 NOTE — Progress Notes (Signed)
  Echocardiogram 2D Echocardiogram has been performed.  Belva ChimesWendy  Ouita Nish 05/03/2018, 9:33 AM

## 2018-05-15 ENCOUNTER — Ambulatory Visit: Payer: BLUE CROSS/BLUE SHIELD | Admitting: Adult Health

## 2018-05-15 ENCOUNTER — Encounter: Payer: Self-pay | Admitting: Adult Health

## 2018-05-15 VITALS — BP 108/68 | HR 60 | Ht 73.0 in | Wt 199.4 lb

## 2018-05-15 DIAGNOSIS — I2510 Atherosclerotic heart disease of native coronary artery without angina pectoris: Secondary | ICD-10-CM

## 2018-05-15 DIAGNOSIS — E7800 Pure hypercholesterolemia, unspecified: Secondary | ICD-10-CM | POA: Diagnosis not present

## 2018-05-15 MED ORDER — NITROGLYCERIN 0.4 MG SL SUBL: 0.4000 mg | SUBLINGUAL_TABLET | SUBLINGUAL | 3 refills | Status: DC | PRN

## 2018-05-15 NOTE — Patient Instructions (Signed)
Medication Instructions:  MAY TAKE NITRO AS NEEDED X3  If you need a refill on your cardiac medications before your next appointment, please call your pharmacy.   Special Instructions: MAKE SURE TO SEE GASTRO-ESOPHAGEAL SPASM  Follow-Up: Your physician wants you to follow-up: AS NEEDED WITH DR Tresa EndoKELLY.   Thank you for choosing [facility]!!

## 2018-05-15 NOTE — Progress Notes (Signed)
Cardiology Office Note   Date:  05/15/2018   ID:  Daniel Wong, Daniel Wong 07-10-1956, MRN [id number]  PCP:  Mayra Neer, MD  Cardiologist:  Dr. Claiborne Billings Chief Complaint  Patient presents with  . Follow-up     History of Present Illness: Daniel Wong is a 62 y.o. male who presents for post hospital follow up after admission for ACS with associated chest pain. EKG revealed ST elevation in the inferior leads. He had emergent cardiac cath reveling minimal disease in the RCA of 25%. No intervention was necessary. Follow up echocardiogram   He comes today without further chest pain. He has a lot of GI complaints to include GERD. He has a GI specialist and is planning to see them for colonoscopy. He has multiple questions about his EKG and cardiac cath.   Past Medical History:  Diagnosis Date  . Allergic rhinitis   . Arthritis    "some in AC joints in my shoulders" (05/02/2018)  . Bronchial pneumonia ~ 1966  . GERD (gastroesophageal reflux disease)   . High cholesterol   . History of gout    "tx'd prn RX" (05/02/2018)  . Hypercalcemia   . Osteopenia   . PSA elevation   . Rosacea     Past Surgical History:  Procedure Laterality Date  . CARDIAC CATHETERIZATION  05/02/2018  . COLONOSCOPY W/ BIOPSIES AND POLYPECTOMY  2014   "no issues"  . ESOPHAGOGASTRODUODENOSCOPY  ~ 2005  . LEFT HEART CATH AND CORONARY ANGIOGRAPHY N/A 05/02/2018   Procedure: LEFT HEART CATH AND CORONARY ANGIOGRAPHY;  Surgeon: Troy Sine, MD;  Location: [place] [facility];  Service: Cardiovascular;  Laterality: N/A;  . TONSILLECTOMY  1960s     Current Outpatient Medications  Medication Sig Dispense Refill  . aspirin 81 MG tablet Take 81 mg by mouth daily.    Marland Kitchen atorvastatin (LIPITOR) 40 MG tablet Take 40 mg by mouth daily at 6 PM.  1  . Azelaic Acid (FINACEA) 15 % cream Apply 15 % topically as needed. After skin is thoroughly washed and patted dry, gently but thoroughly massage a thin film of azelaic acid cream  into the affected area twice daily, in the morning and evening.    Marland Kitchen azelastine (ASTELIN) 0.1 % nasal spray Place 2 sprays into both nostrils 2 (two) times daily. Use in each nostril as directed    . Cholecalciferol (VITAMIN D-3) 1000 units CAPS Take 5,000 Units by mouth daily.    . colchicine 0.6 MG tablet Take 0.6 mg by mouth 2 (two) times daily as needed for pain. Gout flare up  1  . colesevelam (WELCHOL) 625 MG tablet Take 1,250 mg by mouth at bedtime.     Marland Kitchen desonide (DESOWEN) 0.05 % cream Apply 1 application topically as needed (rash in ears).     . diazepam (VALIUM) 10 MG tablet Take 10-20 mg by mouth as needed for anxiety (for flying).    Marland Kitchen doxylamine, Sleep, (UNISOM) 25 MG tablet Take 25 mg by mouth at bedtime.     Marland Kitchen loratadine (CLARITIN) 10 MG tablet Take 10 mg by mouth daily.    . meclizine (ANTIVERT) 25 MG tablet Take 25 mg by mouth 3 (three) times daily as needed for dizziness.    . meloxicam (MOBIC) 7.5 MG tablet Take 7.5 mg by mouth 2 (two) times daily as needed for pain.  1  . Multiple Vitamins-Minerals (MULTIVITAMIN MEN) TABS Take 1 tablet by mouth daily.     . RABEprazole (  ACIPHEX) 20 MG tablet Take 20 mg by mouth daily.    . nitroGLYCERIN (NITROSTAT) 0.4 MG SL tablet Place 1 tablet (0.4 mg total) under the tongue every 5 (five) minutes as needed for chest pain. 25 tablet 3   No current facility-administered medications for this visit.     Allergies:   Sulfa antibiotics    Social History:  The patient  reports that he has never smoked. He has never used smokeless tobacco. He reports that he drinks about 4.2 oz of alcohol per week. He reports that he does not use drugs.   Family History:  The patient's family history includes Multiple myeloma in his mother; Prostate cancer in his father.    ROS: All other systems are reviewed and negative. Unless otherwise mentioned in H&P    PHYSICAL EXAM: VS:  BP 108/68   Pulse 60   Ht 6' 1"  (1.854 m)   Wt 199 lb 6.4 oz (90.4 kg)    BMI 26.31 kg/m  , BMI Body mass index is 26.31 kg/m. GEN: Well nourished, well developed, in no acute distress  HEENT: normal  Neck: no JVD, carotid bruits, or masses Cardiac: RRR; no murmurs, rubs, or gallops,no edema  Respiratory:  clear to auscultation bilaterally, normal work of breathing GI: soft, nontender, nondistended, + BS MS: no deformity or atrophy  Skin: warm and dry, no rash Neuro:  Strength and sensation are intact Psych: euthymic mood, full affect   EKG: Not completed this office visit.   Recent Labs: 05/02/2018: ALT 34 05-29-18: BUN 9; Creatinine, Ser 0.95; Hemoglobin 13.8; Platelets 217; Potassium 4.0; Sodium 141    Lipid Panel    Component Value Date/Time   CHOL 154 05/02/2018 1622   TRIG 75 05/02/2018 1622   HDL 52 05/02/2018 1622   CHOLHDL 3.0 05/02/2018 1622   VLDL 15 05/02/2018 1622   LDLCALC 87 05/02/2018 1622      Wt Readings from Last 3 Encounters:  05/15/18 199 lb 6.4 oz (90.4 kg)  05-29-2018 195 lb 4.8 oz (88.6 kg)  05/06/16 204 lb (92.5 kg)      Other studies Reviewed: Echocardiogram 05/29/2018 Left ventricle: The cavity size was normal. Systolic function was   normal. The estimated ejection fraction was in the range of 60%   to 65%. Wall motion was normal; there were no regional wall   motion abnormalities. - Aortic valve: Transvalvular velocity was within the normal range.   There was no stenosis. There was mild regurgitation. Valve area   (VTI): 3.07 cm^2. Valve area (Vmax): 2.83 cm^2. Valve area   (Vmean): 2.96 cm^2. - Mitral valve: Transvalvular velocity was within the normal range.   There was no evidence for stenosis. There was trivial   regurgitation. - Right ventricle: The cavity size was normal. Wall thickness was   normal. Systolic function was normal. - Tricuspid valve: There was no regurgitation. - Pulmonary arteries: Systolic pressure was within the normal   range. PA peak pressure: 19 mm Hg (S).  Cardiac Cath  Prox  RCA to Mid RCA lesion is 25% stenosed.   Mild nonobstructive coronary artery disease with smooth 20 to 30% eccentric narrowing in the proximal to mid RCA and a dominant RCA system.  Normal left coronary system.  Normal LV function without focal segmental wall motion abnormalities.  LVEDP 11 mm.  RECOMMENDATION: Medical therapy.  The patient will undergo a 2D echo Doppler study to assess for possible pericardial abnormality.  At the completion of the procedure  the patient appeared comfortable but still had mild residual 3 out of 10 chest pain he describes seems to be worse with taking a deep breath.  A subsequent ECG showed improvement in his J-point elevation and was interpreted as possible early repolarization changes.  ASSESSMENT AND PLAN:  1. CAD: S/P cardiac cath in the setting of inferior ST elevation. He was found to have a 25% RCA lesion. No other CAD. He may have had coronary spasm causing pain. His EKG did show elevated J point. He will continue on ASA, and statin.  I have given him NTG SL to use if he has recurrent pain. Can consider low dose CCB if this recurs.  See PRN  2. GERD: He is to see PCP for referral to GI physician to discuss EDG when he has his colonoscopy due to worsening GERD. He is on PPI.   3. Hypercholesterolemia: Continue atorvastatin as directed. Follow up labs per PCP.    Current medicines are reviewed at length with the patient today.    Labs/ tests ordered today include: None  Phill Myron. West Pugh, ANP, AACC   05/15/2018 4:04 PM    [facility] [address], [place], [place] [address] Phone: [phone]; Fax: [phone]

## 2018-07-03 DIAGNOSIS — H6691 Otitis media, unspecified, right ear: Secondary | ICD-10-CM | POA: Diagnosis not present

## 2018-07-03 DIAGNOSIS — M8588 Other specified disorders of bone density and structure, other site: Secondary | ICD-10-CM | POA: Diagnosis not present

## 2018-07-06 DIAGNOSIS — Z23 Encounter for immunization: Secondary | ICD-10-CM | POA: Diagnosis not present

## 2018-08-16 DIAGNOSIS — R079 Chest pain, unspecified: Secondary | ICD-10-CM | POA: Diagnosis not present

## 2018-09-10 DIAGNOSIS — M25561 Pain in right knee: Secondary | ICD-10-CM | POA: Diagnosis not present

## 2018-09-13 DIAGNOSIS — M25561 Pain in right knee: Secondary | ICD-10-CM | POA: Diagnosis not present

## 2018-09-17 DIAGNOSIS — M25561 Pain in right knee: Secondary | ICD-10-CM | POA: Diagnosis not present

## 2018-09-21 DIAGNOSIS — M25561 Pain in right knee: Secondary | ICD-10-CM | POA: Diagnosis not present

## 2018-09-21 DIAGNOSIS — M6281 Muscle weakness (generalized): Secondary | ICD-10-CM | POA: Diagnosis not present

## 2018-09-21 DIAGNOSIS — S838X1D Sprain of other specified parts of right knee, subsequent encounter: Secondary | ICD-10-CM | POA: Diagnosis not present

## 2018-09-21 DIAGNOSIS — M25661 Stiffness of right knee, not elsewhere classified: Secondary | ICD-10-CM | POA: Diagnosis not present

## 2018-09-28 DIAGNOSIS — S838X1D Sprain of other specified parts of right knee, subsequent encounter: Secondary | ICD-10-CM | POA: Diagnosis not present

## 2018-09-28 DIAGNOSIS — M25561 Pain in right knee: Secondary | ICD-10-CM | POA: Diagnosis not present

## 2018-09-28 DIAGNOSIS — M25661 Stiffness of right knee, not elsewhere classified: Secondary | ICD-10-CM | POA: Diagnosis not present

## 2018-09-28 DIAGNOSIS — M6281 Muscle weakness (generalized): Secondary | ICD-10-CM | POA: Diagnosis not present

## 2018-10-05 DIAGNOSIS — S838X1D Sprain of other specified parts of right knee, subsequent encounter: Secondary | ICD-10-CM | POA: Diagnosis not present

## 2018-10-05 DIAGNOSIS — M25561 Pain in right knee: Secondary | ICD-10-CM | POA: Diagnosis not present

## 2018-10-05 DIAGNOSIS — M6281 Muscle weakness (generalized): Secondary | ICD-10-CM | POA: Diagnosis not present

## 2018-10-05 DIAGNOSIS — M25661 Stiffness of right knee, not elsewhere classified: Secondary | ICD-10-CM | POA: Diagnosis not present

## 2018-10-15 DIAGNOSIS — M6281 Muscle weakness (generalized): Secondary | ICD-10-CM | POA: Diagnosis not present

## 2018-10-15 DIAGNOSIS — S838X1D Sprain of other specified parts of right knee, subsequent encounter: Secondary | ICD-10-CM | POA: Diagnosis not present

## 2018-10-15 DIAGNOSIS — M25661 Stiffness of right knee, not elsewhere classified: Secondary | ICD-10-CM | POA: Diagnosis not present

## 2018-10-15 DIAGNOSIS — M25561 Pain in right knee: Secondary | ICD-10-CM | POA: Diagnosis not present

## 2018-10-30 DIAGNOSIS — R05 Cough: Secondary | ICD-10-CM | POA: Diagnosis not present

## 2018-10-30 DIAGNOSIS — J069 Acute upper respiratory infection, unspecified: Secondary | ICD-10-CM | POA: Diagnosis not present

## 2018-11-06 DIAGNOSIS — K5730 Diverticulosis of large intestine without perforation or abscess without bleeding: Secondary | ICD-10-CM | POA: Diagnosis not present

## 2018-11-06 DIAGNOSIS — Z86010 Personal history of colonic polyps: Secondary | ICD-10-CM | POA: Diagnosis not present

## 2018-11-06 DIAGNOSIS — K648 Other hemorrhoids: Secondary | ICD-10-CM | POA: Diagnosis not present

## 2018-11-06 DIAGNOSIS — K6389 Other specified diseases of intestine: Secondary | ICD-10-CM | POA: Diagnosis not present

## 2018-11-06 DIAGNOSIS — R079 Chest pain, unspecified: Secondary | ICD-10-CM | POA: Diagnosis not present

## 2018-11-06 DIAGNOSIS — D126 Benign neoplasm of colon, unspecified: Secondary | ICD-10-CM | POA: Diagnosis not present

## 2018-11-09 DIAGNOSIS — D126 Benign neoplasm of colon, unspecified: Secondary | ICD-10-CM | POA: Diagnosis not present

## 2019-09-02 ENCOUNTER — Other Ambulatory Visit: Payer: Self-pay

## 2019-09-02 DIAGNOSIS — Z20822 Contact with and (suspected) exposure to covid-19: Secondary | ICD-10-CM

## 2019-09-03 LAB — NOVEL CORONAVIRUS, NAA: SARS-CoV-2, NAA: NOT DETECTED

## 2020-04-21 ENCOUNTER — Other Ambulatory Visit: Payer: Self-pay | Admitting: Family Medicine

## 2020-04-21 DIAGNOSIS — M8580 Other specified disorders of bone density and structure, unspecified site: Secondary | ICD-10-CM

## 2020-07-16 ENCOUNTER — Ambulatory Visit
Admission: RE | Admit: 2020-07-16 | Discharge: 2020-07-16 | Disposition: A | Discharge status: Home / Self Care | Payer: Managed Care, Other (non HMO) | Source: Ambulatory Visit | Attending: Family Medicine | Admitting: Family Medicine

## 2020-07-16 ENCOUNTER — Other Ambulatory Visit: Payer: Self-pay

## 2020-07-16 DIAGNOSIS — M8580 Other specified disorders of bone density and structure, unspecified site: Secondary | ICD-10-CM

## 2021-04-26 ENCOUNTER — Other Ambulatory Visit: Payer: Self-pay | Admitting: Family Medicine

## 2021-04-26 DIAGNOSIS — K5792 Diverticulitis of intestine, part unspecified, without perforation or abscess without bleeding: Secondary | ICD-10-CM

## 2021-05-14 ENCOUNTER — Ambulatory Visit
Admission: RE | Admit: 2021-05-14 | Discharge: 2021-05-14 | Disposition: A | Discharge status: Home / Self Care | Payer: Managed Care, Other (non HMO) | Source: Ambulatory Visit | Attending: Family Medicine | Admitting: Family Medicine

## 2021-05-14 DIAGNOSIS — K5792 Diverticulitis of intestine, part unspecified, without perforation or abscess without bleeding: Secondary | ICD-10-CM

## 2021-05-14 MED ORDER — IOPAMIDOL (ISOVUE-300) INJECTION 61%
100.0000 mL | Freq: Once | INTRAVENOUS | Status: AC | PRN
  Administered 2021-05-14: 100 mL via INTRAVENOUS

## 2021-05-19 ENCOUNTER — Other Ambulatory Visit: Payer: Self-pay | Admitting: Family Medicine

## 2021-05-19 ENCOUNTER — Ambulatory Visit
Admission: RE | Admit: 2021-05-19 | Discharge: 2021-05-19 | Disposition: A | Discharge status: Home / Self Care | Payer: Managed Care, Other (non HMO) | Source: Ambulatory Visit | Attending: Family Medicine | Admitting: Family Medicine

## 2021-05-19 ENCOUNTER — Other Ambulatory Visit: Payer: Self-pay

## 2021-05-19 DIAGNOSIS — S6992XA Unspecified injury of left wrist, hand and finger(s), initial encounter: Secondary | ICD-10-CM

## 2022-02-23 IMAGING — CR DG FINGER MIDDLE 2+V*L*
2 series · 2 of 2 positions shown · non-contrast
Comparison: None.

CLINICAL DATA: Finger injury.  Pain third digit.

EXAM:
LEFT MIDDLE FINGER 2+V

[x finger pa left]
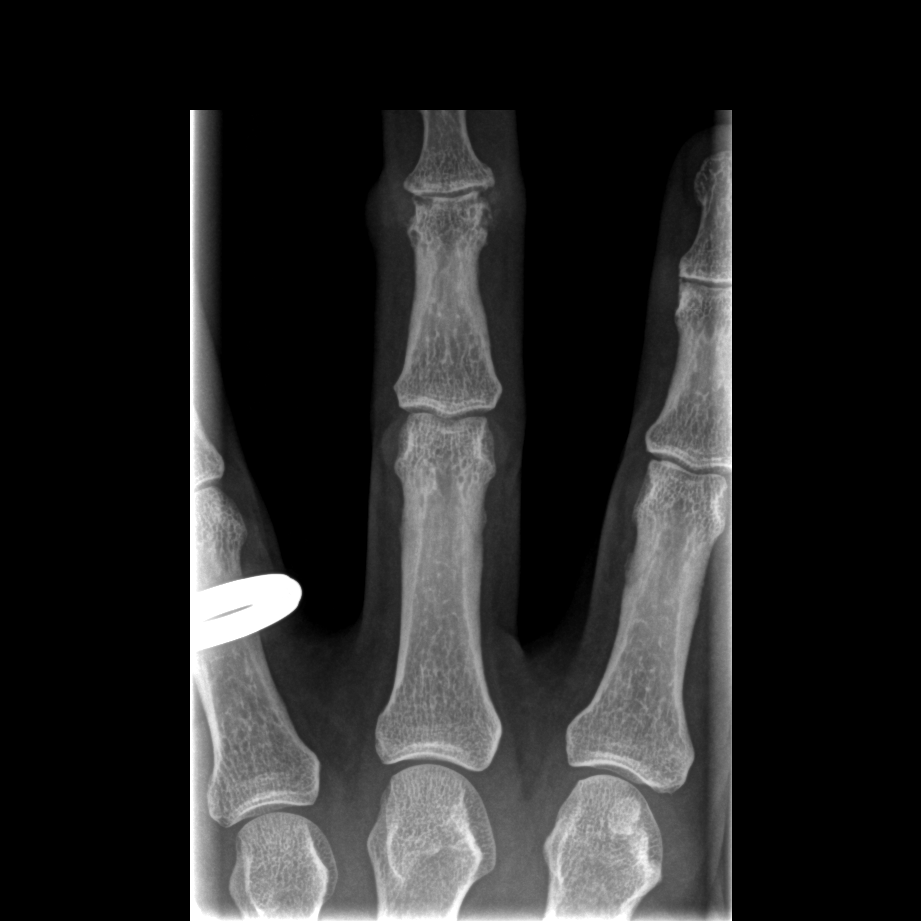

[x finger lateral left]
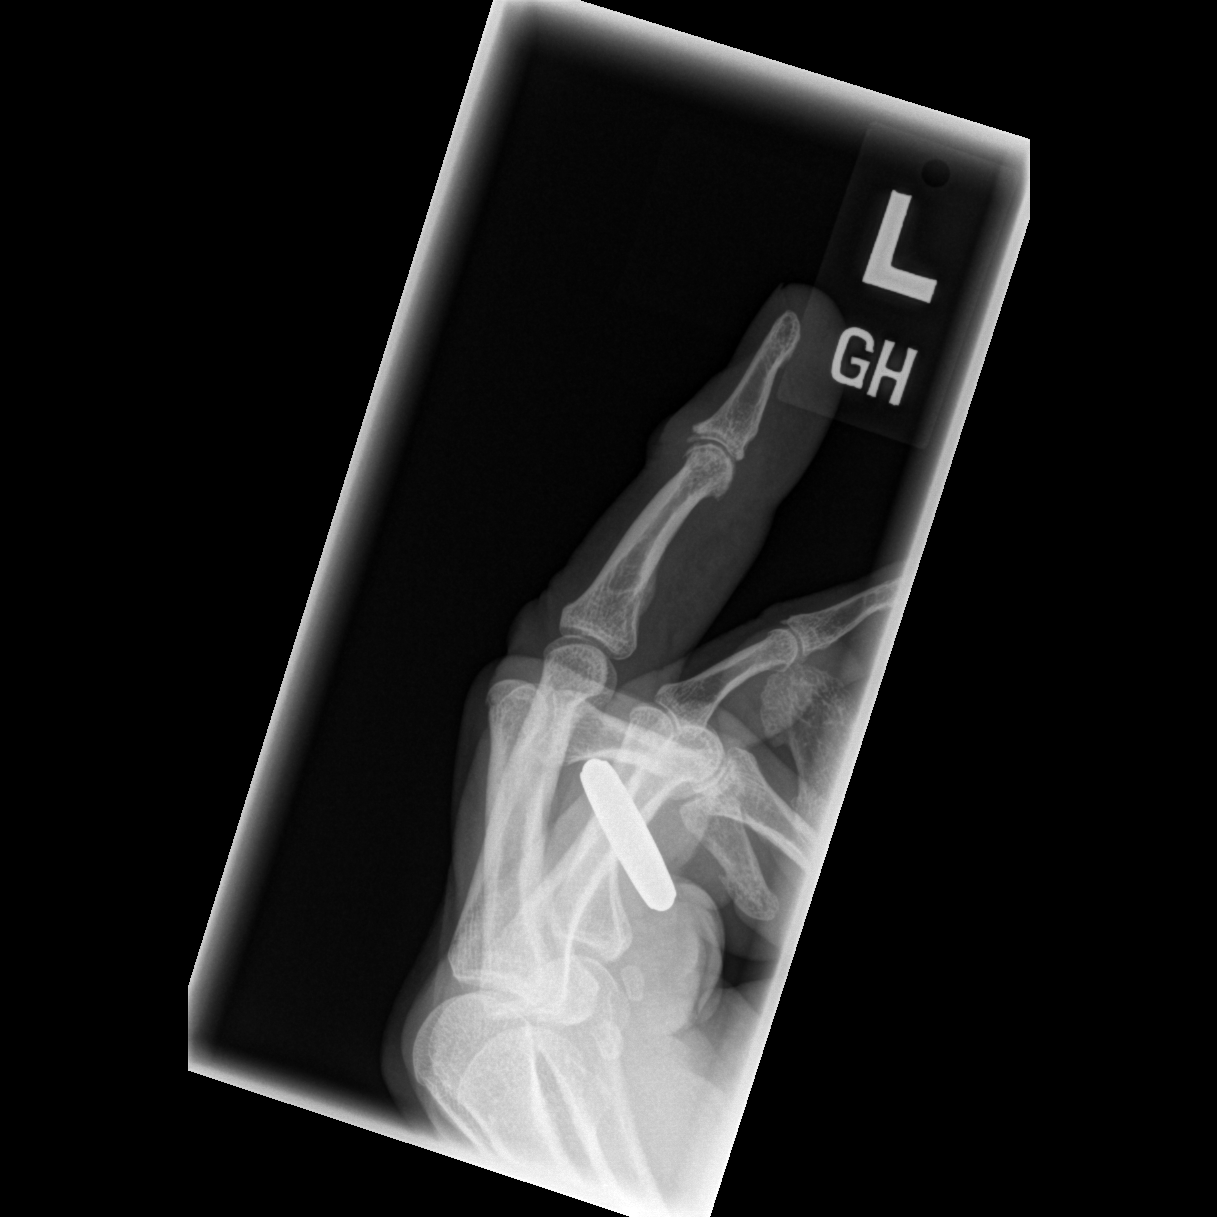

[2 of 2 positions shown; findings below may reference images not displayed]

FINDINGS: Negative for fracture

Joint space narrowing and mild spurring in the third D IP joint with
surrounding soft tissue swelling. Possible periarticular erosions in
the D IP joint. MCP and D IP joint intact.
IMPRESSION: Arthropathy of the third distal phalanx with joint space narrowing
spurring and soft tissue swelling and mild periarticular erosion.

## 2022-05-27 ENCOUNTER — Other Ambulatory Visit: Payer: Self-pay | Admitting: Family Medicine

## 2022-05-27 DIAGNOSIS — M8580 Other specified disorders of bone density and structure, unspecified site: Secondary | ICD-10-CM

## 2022-07-28 ENCOUNTER — Ambulatory Visit
Admission: RE | Admit: 2022-07-28 | Discharge: 2022-07-28 | Disposition: A | Discharge status: Home / Self Care | Payer: 59 | Source: Ambulatory Visit | Attending: Family Medicine | Admitting: Family Medicine

## 2022-07-28 DIAGNOSIS — M8580 Other specified disorders of bone density and structure, unspecified site: Secondary | ICD-10-CM

## 2022-08-19 ENCOUNTER — Ambulatory Visit: Payer: 59 | Attending: Cardiovascular Disease | Admitting: Cardiovascular Disease

## 2022-08-19 ENCOUNTER — Ambulatory Visit (INDEPENDENT_AMBULATORY_CARE_PROVIDER_SITE_OTHER): Payer: 59

## 2022-08-19 ENCOUNTER — Encounter: Payer: Self-pay | Admitting: Cardiovascular Disease

## 2022-08-19 VITALS — BP 114/70 | HR 59 | Ht 72.0 in | Wt 188.8 lb

## 2022-08-19 DIAGNOSIS — I455 Other specified heart block: Secondary | ICD-10-CM | POA: Diagnosis not present

## 2022-08-19 DIAGNOSIS — M8580 Other specified disorders of bone density and structure, unspecified site: Secondary | ICD-10-CM | POA: Diagnosis not present

## 2022-08-19 DIAGNOSIS — E7800 Pure hypercholesterolemia, unspecified: Secondary | ICD-10-CM

## 2022-08-19 NOTE — Progress Notes (Unsigned)
Enrolled patient for a 3 day Zio XT monitor to be mailed to patients home  

## 2022-08-19 NOTE — Patient Instructions (Signed)
Medication Instructions:  Your physician recommends that you continue on your current medications as directed. Please refer to the Current Medication list given to you today.  *If you need a refill on your cardiac medications before your next appointment, please call your pharmacy*  Lab Work: NONE ordered at this time of appointment   If you have labs (blood work) drawn today and your tests are completely normal, you will receive your results only by: [place] (if you have MyChart) OR A paper copy in the mail If you have any lab test that is abnormal or we need to change your treatment, we will call you to review the results.  Testing/Procedures:  Bryn Gulling- Long Term Monitor Instructions  Your physician has requested you wear a ZIO patch monitor for 3 days.  This is a single patch monitor. [employer] supplies one patch monitor per enrollment. Additional stickers are not available. Please do not apply patch if you will be having a Nuclear Stress Test,  Echocardiogram, Cardiac CT, MRI, or Chest Xray during the period you would be wearing the  monitor. The patch cannot be worn during these tests. You cannot remove and re-apply the  ZIO XT patch monitor.  Your ZIO patch monitor will be mailed 3 day USPS to your address on file. It may take 3-5 days  to receive your monitor after you have been enrolled.  Once you have received your monitor, please review the enclosed instructions. Your monitor  has already been registered assigning a specific monitor serial # to you.  Billing and Patient Assistance Program Information  We have supplied [employer] with any of your insurance information on file for billing purposes. [employer] offers a sliding scale Patient Assistance Program for patients that do not have  insurance, or whose insurance does not completely cover the cost of the ZIO monitor.  You must apply for the Patient Assistance Program to qualify for this discounted rate.  To apply, please  call [employer] at [phone], select option 4, select option 2, ask to apply for  Patient Assistance Program. Theodore Demark will ask your household income, and how many people  are in your household. They will quote your out-of-pocket cost based on that information.  [employer] will also be able to set up a 73-month, interest-free payment plan if needed.  Applying the monitor   Shave hair from upper left chest.  Hold abrader disc by orange tab. Rub abrader in 40 strokes over the upper left chest as  indicated in your monitor instructions.  Clean area with 4 enclosed alcohol pads. Let dry.  Apply patch as indicated in monitor instructions. Patch will be placed under collarbone on left  side of chest with arrow pointing upward.  Rub patch adhesive wings for 2 minutes. Remove white label marked "1". Remove the white  label marked "2". Rub patch adhesive wings for 2 additional minutes.  While looking in a mirror, press and release button in center of patch. A small green light will  flash 3-4 times. This will be your only indicator that the monitor has been turned on.  Do not shower for the first 24 hours. You may shower after the first 24 hours.  Press the button if you feel a symptom. You will hear a small click. Record Date, Time and  Symptom in the Patient Logbook.  When you are ready to remove the patch, follow instructions on the last 2 pages of Patient  Logbook. Stick patch monitor onto the last page of  Patient Logbook.  Place Patient Logbook in the blue and white box. Use locking tab on box and tape box closed  securely. The blue and white box has prepaid postage on it. Please place it in the mailbox as  soon as possible. Your physician should have your test results approximately 7 days after the  monitor has been mailed back to [facility].  Call [facility] Customer Care at [phone] if you have questions regarding  your ZIO XT patch monitor. Call them immediately if you see an  orange light blinking on your  monitor.  If your monitor falls off in less than 4 days, contact our Monitor department at [phone].  If your monitor becomes loose or falls off after 4 days call [employer] at [phone] for  suggestions on securing your monitor  Follow-Up: At [facility], you and your health needs are our priority.  As part of our continuing mission to provide you with exceptional heart care, we have created designated Provider Care Teams.  These Care Teams include your primary Cardiologist (physician) and Advanced Practice Providers (APPs -  Physician Assistants and Nurse Practitioners) who all work together to provide you with the care you need, when you need it.  Your next appointment:   1 year(s)  The format for your next appointment:   In Person  Provider:   Thurmon Fair, MD     Other Instructions  Important Information About Sugar

## 2022-08-19 NOTE — Progress Notes (Signed)
Cardiology Office Note:    Date:  08/19/2022   ID:  URI TURNBOUGH, DOB [birth year], MRN [id number]  PCP:  Mayra Neer, MD   [place] Providers Cardiologist:  Sanda Klein, MD     Referring MD: Mayra Neer, MD   No chief complaint on file. Daniel Wong is a 66 y.o. male who is being seen today for the evaluation of arrhythmia at the request of Mayra Neer, MD.   History of Present Illness:    Daniel Wong is a 66 y.o. male with a hx of hypercholesterolemia, minor coronary atherosclerosis (cardiac catheterization 2019), GERD, mild hypercalcemia, osteopenia, referred in consultation for heart block detected during a colonoscopy performed with propofol sedation.  His first visit to the cardiology office since 2019.  Denies any problems with presyncope or syncope or fatigue.  He exercises with a personal trainer 2 or 3 times a week, walks 30 minutes at 4-4.2 miles an hour on a treadmill 4-5 days a week.  He has not had any problems with palpitations.  He denies exertional dyspnea or angina, orthopnea, PND, lower extremity edema, claudication or focal neurological complaints.  He gets briefly lightheaded if he bends over and stands up quickly.  In 2019 he had transient chest pain after eating breakfast very quickly.  His EKG was interpreted as possibly showing inferior STEMI.  He underwent coronary angiography which showed only minor plaque.  It was felt that his symptoms were due to esophageal spasm.  Today, his ECG still has a nonspecific inferior ST segment elevation with elevated J-point typical of early repolarization.    Past Medical History:  Diagnosis Date   Allergic rhinitis    Arthritis    "some in AC joints in my shoulders" (05/02/2018)   Bronchial pneumonia ~ 1966   GERD (gastroesophageal reflux disease)    High cholesterol    History of gout    "tx'd prn RX" (05/02/2018)   Hypercalcemia    Osteopenia    PSA elevation    Rosacea      Past Surgical History:  Procedure Laterality Date   CARDIAC CATHETERIZATION  05/02/2018   COLONOSCOPY W/ BIOPSIES AND POLYPECTOMY  2014   "no issues"   ESOPHAGOGASTRODUODENOSCOPY  ~ 2005   LEFT HEART CATH AND CORONARY ANGIOGRAPHY N/A 05/02/2018   Procedure: LEFT HEART CATH AND CORONARY ANGIOGRAPHY;  Surgeon: Troy Sine, MD;  Location: [facility];  Service: Cardiovascular;  Laterality: N/A;   TONSILLECTOMY  1960s    Current Medications: Current Meds  Medication Sig   aspirin 81 MG tablet Take 81 mg by mouth daily.   atorvastatin (LIPITOR) 40 MG tablet Take 40 mg by mouth daily at 6 PM.   Azelaic Acid (FINACEA) 15 % cream Apply 15 % topically as needed. After skin is thoroughly washed and patted dry, gently but thoroughly massage a thin film of azelaic acid cream into the affected area twice daily, in the morning and evening.   azelastine (ASTELIN) 0.1 % nasal spray Place 2 sprays into both nostrils 2 (two) times daily. Use in each nostril as directed   Cholecalciferol (VITAMIN D-3) 1000 units CAPS Take 5,000 Units by mouth daily.   doxylamine, Sleep, (UNISOM) 25 MG tablet Take 25 mg by mouth at bedtime.    guaiFENesin (MUCINEX) 600 MG 12 hr tablet 600 mg 2 (two) times daily as needed.   loratadine (CLARITIN) 10 MG tablet Take 10 mg by mouth daily.   Multiple Vitamins-Minerals (MULTIVITAMIN MEN)  TABS Take 1 tablet by mouth daily.    Probiotic Product ([place]) CAPS Take 1 capsule by mouth daily in the afternoon.   pseudoephedrine (SUDAFED) 120 MG 12 hr tablet 120 mg daily as needed.   RABEprazole (ACIPHEX) 20 MG tablet Take 20 mg by mouth daily.     Allergies:   Sulfa antibiotics   Social History   Socioeconomic History   Marital status: Married    Spouse name: Not on file   Number of children: Not on file   Years of education: Not on file   Highest education level: Not on file  Occupational History   Not on file  Tobacco Use   Smoking status:  Never   Smokeless tobacco: Never  Vaping Use   Vaping Use: Never used  Substance and Sexual Activity   Alcohol use: Yes    Alcohol/week: 7.0 standard drinks of alcohol    Types: 7 Glasses of wine per week   Drug use: Never   Sexual activity: Yes  Other Topics Concern   Not on file  Social History Narrative   Not on file   Social Determinants of Health   Financial Resource Strain: Not on file  Food Insecurity: Not on file  Transportation Needs: Not on file  Physical Activity: Not on file  Stress: Not on file  Social Connections: Not on file     Family History: The patient's family history includes Multiple myeloma in his mother; Prostate cancer in his father. There is no history of Hypercalcemia.  ROS:   Please see the history of present illness.     All other systems reviewed and are negative.  EKGs/Labs/Other Studies Reviewed:    The following studies were reviewed today: Cardiac catheterization 05/02/2018  Prox RCA to Mid RCA lesion is 25% stenosed.   Mild nonobstructive coronary artery disease with smooth 20 to 30% eccentric narrowing in the proximal to mid RCA and a dominant RCA system.   Normal left coronary system.   Normal LV function without focal segmental wall motion abnormalities.  LVEDP 11 mm.   RECOMMENDATION: Medical therapy.  The patient will undergo a 2D echo Doppler study to assess for possible pericardial abnormality.  At the completion of the procedure the patient appeared comfortable but still had mild residual 3 out of 10 chest pain he describes seems to be worse with taking a deep breath.  A subsequent ECG showed improvement in his J-point elevation and was interpreted as possible early repolarization changes.   Recommend Aspirin 68m daily for moderate CAD.  The patient will continue with high potency statin therapy and antireflux therapy.  Echocardiogram 05/03/2018   - Left ventricle: The cavity size was normal. Systolic function was     normal. The estimated ejection fraction was in the range of 60%    to 65%. Wall motion was normal; there were no regional wall    motion abnormalities.  - Aortic valve: Transvalvular velocity was within the normal range.    There was no stenosis. There was mild regurgitation. Valve area    (VTI): 3.07 cm^2. Valve area (Vmax): 2.83 cm^2. Valve area    (Vmean): 2.96 cm^2.  - Mitral valve: Transvalvular velocity was within the normal range.    There was no evidence for stenosis. There was trivial    regurgitation.  - Right ventricle: The cavity size was normal. Wall thickness was    normal. Systolic function was normal.  - Tricuspid valve: There was no regurgitation.  -  Pulmonary arteries: Systolic pressure was within the normal    range. PA peak pressure: 19 mm Hg (S).    EKG:  EKG is  ordered today.  The ekg ordered today demonstrates NS rhythm with a single PAC, early repolarization pattern with J-point elevation most obvious in the inferior leads.  QTc 384 ms  Review of a brief telemetry strip from his colonoscopy shows grouped beating with progressive shortening of the P-P interval before there is a dropped P wave consistent with second-degree sinoatrial block Mobitz type I  Recent Labs: No results found for requested labs within last 365 days.  Labs from [facility] Calcium 11.4, creatinine 1.01, normal CBC, normal TSH, normal vitamin D and intact PTH levels Recent Lipid Panel    Component Value Date/Time   CHOL 154 05/02/2018 1622   TRIG 75 05/02/2018 1622   HDL 52 05/02/2018 1622   CHOLHDL 3.0 05/02/2018 1622   VLDL 15 05/02/2018 1622   LDLCALC 87 05/02/2018 1622  Labs from 07/02/2022 from [facility]  Cholesterol 158, HDL 64, LDL 81, triglycerides 67   Risk Assessment/Calculations:                Physical Exam:    VS:  BP 114/70   Pulse (!) 59   Ht 6' (1.829 m)   Wt 85.6 kg   SpO2 95%   BMI 25.61 kg/m     Wt Readings from Last 3 Encounters:   08/19/22 85.6 kg  05/15/18 90.4 kg  05/03/18 88.6 kg     General: Alert, oriented x3, no distress, appears healthy and fit Head: no evidence of trauma, PERRL, EOMI, no exophtalmos or lid lag, no myxedema, no xanthelasma; normal ears, nose and oropharynx Neck: normal jugular venous pulsations and no hepatojugular reflux; brisk carotid pulses without delay and no carotid bruits Chest: clear to auscultation, no signs of consolidation by percussion or palpation, normal fremitus, symmetrical and full respiratory excursions Cardiovascular: normal position and quality of the apical impulse, regular rhythm, normal first and second heart sounds, no murmurs, rubs or gallops Abdomen: no tenderness or distention, no masses by palpation, no abnormal pulsatility or arterial bruits, normal bowel sounds, no hepatosplenomegaly Extremities: no clubbing, cyanosis or edema; 2+ radial, ulnar and brachial pulses bilaterally; 2+ right femoral, posterior tibial and dorsalis pedis pulses; 2+ left femoral, posterior tibial and dorsalis pedis pulses; no subclavian or femoral bruits Neurological: grossly nonfocal Psych: Normal mood and affect  His physical exam there was a change in rhythm to a pattern of grouped beating/bigeminy (either PACs with atrial bigeminy or sinoatrial block with a 3: 2 cycle).   ASSESSMENT:    1. SA block   2. Hypercholesterolemia   3. Hypercalcemia   4. Osteopenia, unspecified location    PLAN:    In order of problems listed above:  Second-degree sinoatrial block Mobitz type I: This might have been physiological during high vagal tone and colonoscopy procedure, but he also appeared to have grouped beating during his physical exam, although we did not catch this on ECG.  It is possible that he does have some sinus node disease that has not yet clinically apparent or relevant.  We will have him wear a 3-day monitor.  At this point pacemaker therapy does not appear to be necessary unless  we identify asymptomatic pauses in excess of 3 seconds. Hypercholesterolemia: He really does not have coronary disease or known peripheral arterial disease.  Target LDL less than 100.  He is at this  target on the current statin prescription which she should continue. Hypercalcemia: This is mild, but since he has a history of osteopenia additional work-up may be relevant.  Note that he has normal levels of intact PTH and vitamin D.  I do not think this has any relation to his SA block.           Medication Adjustments/Labs and Tests Ordered: Current medicines are reviewed at length with the patient today.  Concerns regarding medicines are outlined above.  Orders Placed This Encounter  Procedures   LONG TERM MONITOR (3-14 DAYS)   EKG 12-Lead   No orders of the defined types were placed in this encounter.   Patient Instructions  Medication Instructions:  Your physician recommends that you continue on your current medications as directed. Please refer to the Current Medication list given to you today.  *If you need a refill on your cardiac medications before your next appointment, please call your pharmacy*  Lab Work: NONE ordered at this time of appointment   If you have labs (blood work) drawn today and your tests are completely normal, you will receive your results only by: [place] (if you have MyChart) OR A paper copy in the mail If you have any lab test that is abnormal or we need to change your treatment, we will call you to review the results.  Testing/Procedures:  [facility]- Long Term Monitor Instructions  Your physician has requested you wear a ZIO patch monitor for 3 days.  This is a single patch monitor. [employer] supplies one patch monitor per enrollment. Additional stickers are not available. Please do not apply patch if you will be having a Nuclear Stress Test,  Echocardiogram, Cardiac CT, MRI, or Chest Xray during the period you would be wearing the  monitor.  The patch cannot be worn during these tests. You cannot remove and re-apply the  ZIO XT patch monitor.  Your ZIO patch monitor will be mailed 3 day USPS to your address on file. It may take 3-5 days  to receive your monitor after you have been enrolled.  Once you have received your monitor, please review the enclosed instructions. Your monitor  has already been registered assigning a specific monitor serial # to you.  Billing and Patient Assistance Program Information  We have supplied [employer] with any of your insurance information on file for billing purposes. [employer] offers a sliding scale Patient Assistance Program for patients that do not have  insurance, or whose insurance does not completely cover the cost of the ZIO monitor.  You must apply for the Patient Assistance Program to qualify for this discounted rate.  To apply, please call [employer] at [phone], select option 4, select option 2, ask to apply for  Patient Assistance Program. Theodore Demark will ask your household income, and how many people  are in your household. They will quote your out-of-pocket cost based on that information.  [employer] will also be able to set up a 49-month interest-free payment plan if needed.  Applying the monitor   Shave hair from upper left chest.  Hold abrader disc by orange tab. Rub abrader in 40 strokes over the upper left chest as  indicated in your monitor instructions.  Clean area with 4 enclosed alcohol pads. Let dry.  Apply patch as indicated in monitor instructions. Patch will be placed under collarbone on left  side of chest with arrow pointing upward.  Rub patch adhesive wings for 2 minutes. Remove white label marked "1".  Remove the white  label marked "2". Rub patch adhesive wings for 2 additional minutes.  While looking in a mirror, press and release button in center of patch. A small green light will  flash 3-4 times. This will be your only indicator that the monitor has been turned on.   Do not shower for the first 24 hours. You may shower after the first 24 hours.  Press the button if you feel a symptom. You will hear a small click. Record Date, Time and  Symptom in the Patient Logbook.  When you are ready to remove the patch, follow instructions on the last 2 pages of Patient  Logbook. Stick patch monitor onto the last page of Patient Logbook.  Place Patient Logbook in the blue and white box. Use locking tab on box and tape box closed  securely. The blue and white box has prepaid postage on it. Please place it in the mailbox as  soon as possible. Your physician should have your test results approximately 7 days after the  monitor has been mailed back to [facility].  Call [facility] at [phone] if you have questions regarding  your ZIO XT patch monitor. Call them immediately if you see an orange light blinking on your  monitor.  If your monitor falls off in less than 4 days, contact our Monitor department at [phone].  If your monitor becomes loose or falls off after 4 days call [employer] at [phone] for  suggestions on securing your monitor  Follow-Up: At [facility], you and your health needs are our priority.  As part of our continuing mission to provide you with exceptional heart care, we have created designated Provider Care Teams.  These Care Teams include your primary Cardiologist (physician) and Advanced Practice Providers (APPs -  Physician Assistants and Nurse Practitioners) who all work together to provide you with the care you need, when you need it.  Your next appointment:   1 year(s)  The format for your next appointment:   In Person  Provider:   Sanda Klein, MD     Other Instructions  Important Information About Sugar         Signed, Sanda Klein, MD  08/19/2022 1:02 PM    [place]

## 2022-08-31 DIAGNOSIS — I455 Other specified heart block: Secondary | ICD-10-CM

## 2022-09-10 ENCOUNTER — Encounter: Payer: Self-pay | Admitting: Cardiovascular Disease

## 2022-09-10 DIAGNOSIS — E7800 Pure hypercholesterolemia, unspecified: Secondary | ICD-10-CM

## 2022-11-21 ENCOUNTER — Other Ambulatory Visit: Payer: Managed Care, Other (non HMO)

## 2022-12-02 NOTE — Telephone Encounter (Signed)
Please order coronary calcium score and VascuScreen.

## 2022-12-12 ENCOUNTER — Ambulatory Visit (HOSPITAL_BASED_OUTPATIENT_CLINIC_OR_DEPARTMENT_OTHER)
Admission: RE | Admit: 2022-12-12 | Discharge: 2022-12-12 | Disposition: A | Discharge status: Home / Self Care | Payer: 59 | Source: Ambulatory Visit | Attending: Cardiovascular Disease | Admitting: Cardiovascular Disease

## 2022-12-12 DIAGNOSIS — E7800 Pure hypercholesterolemia, unspecified: Secondary | ICD-10-CM | POA: Insufficient documentation

## 2022-12-20 ENCOUNTER — Ambulatory Visit (HOSPITAL_COMMUNITY)
Admission: RE | Admit: 2022-12-20 | Discharge: 2022-12-20 | Disposition: A | Discharge status: Home / Self Care | Payer: Self-pay | Source: Ambulatory Visit | Attending: Cardiovascular Disease | Admitting: Cardiovascular Disease

## 2022-12-20 ENCOUNTER — Encounter: Payer: Self-pay | Admitting: Cardiovascular Disease

## 2022-12-20 DIAGNOSIS — E7800 Pure hypercholesterolemia, unspecified: Secondary | ICD-10-CM

## 2023-01-05 ENCOUNTER — Encounter (HOSPITAL_COMMUNITY): Payer: Self-pay | Admitting: Cardiology

## 2023-02-10 ENCOUNTER — Other Ambulatory Visit (HOSPITAL_COMMUNITY): Payer: Self-pay | Admitting: Endocrinology

## 2023-02-10 DIAGNOSIS — E210 Primary hyperparathyroidism: Secondary | ICD-10-CM

## 2023-02-22 ENCOUNTER — Ambulatory Visit (HOSPITAL_COMMUNITY)
Admission: RE | Admit: 2023-02-22 | Discharge: 2023-02-22 | Disposition: A | Discharge status: Home / Self Care | Payer: 59 | Source: Ambulatory Visit | Attending: Endocrinology | Admitting: Endocrinology

## 2023-02-22 ENCOUNTER — Encounter (HOSPITAL_COMMUNITY)
Admission: RE | Admit: 2023-02-22 | Discharge: 2023-02-22 | Disposition: A | Discharge status: Home / Self Care | Payer: 59 | Source: Ambulatory Visit | Attending: Endocrinology | Admitting: Endocrinology

## 2023-02-22 DIAGNOSIS — E210 Primary hyperparathyroidism: Secondary | ICD-10-CM | POA: Diagnosis not present

## 2023-02-22 MED ORDER — TECHNETIUM TC 99M SESTAMIBI GENERIC - CARDIOLITE
24.1000 | Freq: Once | INTRAVENOUS | Status: AC
  Administered 2023-02-22: 24.1 via INTRAVENOUS

## 2023-03-09 ENCOUNTER — Ambulatory Visit: Payer: Self-pay | Admitting: Surgery

## 2023-03-15 ENCOUNTER — Encounter (HOSPITAL_COMMUNITY): Payer: Self-pay

## 2023-03-15 ENCOUNTER — Other Ambulatory Visit: Payer: Self-pay

## 2023-03-15 ENCOUNTER — Encounter: Payer: Self-pay | Admitting: Cardiovascular Disease

## 2023-03-15 ENCOUNTER — Encounter (HOSPITAL_COMMUNITY)
Admission: RE | Admit: 2023-03-15 | Discharge: 2023-03-15 | Disposition: A | Discharge status: Home / Self Care | Payer: Medicare Other | Source: Ambulatory Visit | Attending: Surgery | Admitting: Surgery

## 2023-03-15 VITALS — BP 124/79 | HR 61 | Temp 98.3°F | Resp 12 | Ht 72.0 in | Wt 187.6 lb

## 2023-03-15 DIAGNOSIS — I2510 Atherosclerotic heart disease of native coronary artery without angina pectoris: Secondary | ICD-10-CM | POA: Diagnosis not present

## 2023-03-15 DIAGNOSIS — Z01812 Encounter for preprocedural laboratory examination: Secondary | ICD-10-CM | POA: Diagnosis not present

## 2023-03-15 HISTORY — DX: Gout, unspecified: M10.9

## 2023-03-15 HISTORY — DX: Hyperparathyroidism, unspecified: E21.3

## 2023-03-15 LAB — BASIC METABOLIC PANEL
Anion gap: 8 (ref 5–15)
BUN: 25 mg/dL — ABNORMAL HIGH (ref 8–23)
CO2: 26 mmol/L (ref 22–32)
Calcium: 10.7 mg/dL — ABNORMAL HIGH (ref 8.9–10.3)
Chloride: 105 mmol/L (ref 98–111)
Creatinine, Ser: 1.14 mg/dL (ref 0.61–1.24)
GFR, Estimated: >60 mL/min (ref >60)
Glucose, Bld: 110 mg/dL — ABNORMAL HIGH (ref 70–99)
Potassium: 4.1 mmol/L (ref 3.5–5.1)
Sodium: 139 mmol/L (ref 135–145)

## 2023-03-15 NOTE — Progress Notes (Addendum)
COVID Vaccine Completed:  Yes  Date of COVID positive in last 90 days:  No  PCP - Lupita Raider, MD Cardiologist - Thurmon Fair, MD  Chest x-ray - N/A EKG - 08-19-22 Epic Stress Test - N/A ECHO - 04-13-18 Epic Cardiac Cath - 05-02-18 Epic Pacemaker/ICD device last checked: Spinal Cord Stimulator: Cardiac CT - 12-12-22 Epic Long Term Monitor - 09-08-22 Epic  Bowel Prep -  N/A  Sleep Study - N/A CPAP -   Fasting Blood Sugar - N/A Checks Blood Sugar _____ times a day  Last dose of GLP1 agonist-  N/A GLP1 instructions:  N/A   Last dose of SGLT-2 inhibitors-  N/A SGLT-2 instructions: N/A   Blood Thinner Instructions:  Time Aspirin Instructions:  ASA 81.  Per patient to stop 5 days before  Last Dose:  03-15-23  Activity level:  Can go up a flight of stairs and perform activities of daily living without stopping and without symptoms of chest pain or shortness of breath.  Able to exercise without symptoms  Unable to go up a flight of stairs without symptoms of     Anesthesia review: Acute coronary syndrome,  SA block, chest pain  Patient denies shortness of breath, fever, cough and chest pain at PAT appointment  Patient verbalized understanding of instructions that were given to them at the PAT appointment. Patient was also instructed that they will need to review over the PAT instructions again at home before surgery.

## 2023-03-15 NOTE — Patient Instructions (Addendum)
SURGICAL WAITING ROOM VISITATION Patients having surgery or a procedure may have no more than 2 support people in the waiting area - these visitors may rotate.    Children under the age of 68 must have an adult with them who is not the patient.  If the patient needs to stay at the hospital during part of their recovery, the visitor guidelines for inpatient rooms apply. Pre-op nurse will coordinate an appropriate time for 1 support person to accompany patient in pre-op.  This support person may not rotate.    Please refer to the [facility] website for the visitor guidelines for Inpatients (after your surgery is over and you are in a regular room).       Your procedure is scheduled on: 03-21-23   Report to [facility] Main Entrance    Report to admitting at 7:45 AM   Call this number if you have problems the morning of surgery [phone]   Do not eat food :After Midnight.   After Midnight you may have the following liquids until 7:00 AM DAY OF SURGERY  Water Non-Citrus Juices (without pulp, NO RED-Apple, White grape, White cranberry) Black Coffee (NO MILK/CREAM OR CREAMERS, sugar ok)  Clear Tea (NO MILK/CREAM OR CREAMERS, sugar ok) regular and decaf                             Plain Jell-O (NO RED)                                           Fruit ices (not with fruit pulp, NO RED)                                     Popsicles (NO RED)                                                               Sports drinks like Gatorade (NO RED)                       If you have questions, please contact your surgeon's office.   FOLLOW  ANY ADDITIONAL PRE OP INSTRUCTIONS YOU RECEIVED FROM YOUR SURGEON'S OFFICE!!!     Oral Hygiene is also important to reduce your risk of infection.                                    Remember - BRUSH YOUR TEETH THE MORNING OF SURGERY WITH YOUR REGULAR TOOTHPASTE   Do NOT smoke after Midnight   Take these medicines the morning of surgery with A SIP  OF WATER:   Atorvastatin  Claritin  Colchicine  Diazepam  Aciphex  Okay to use nasal spray                              You may not have any metal on your body including jewelry, and body piercing  Do not wear  lotions, powders, cologne, or deodorant              Men may shave face and neck.   Do not bring valuables to the hospital. [facility] IS NOT RESPONSIBLE   FOR VALUABLES.   Contacts, dentures or bridgework may not be worn into surgery.   DO NOT BRING YOUR HOME MEDICATIONS TO THE HOSPITAL. PHARMACY WILL DISPENSE MEDICATIONS LISTED ON YOUR MEDICATION LIST TO YOU DURING YOUR ADMISSION IN THE HOSPITAL!    Patients discharged on the day of surgery will not be allowed to drive home.  Someone NEEDS to stay with you for the first 24 hours after anesthesia.   Special Instructions: Bring a copy of your healthcare power of attorney and living will documents the day of surgery if you haven't scanned them before.              Please read over the following fact sheets you were given: IF YOU HAVE QUESTIONS ABOUT YOUR PRE-OP INSTRUCTIONS PLEASE CALL [phone] [place]  If you received a COVID test during your pre-op visit  it is requested that you wear a mask when out in public, stay away from anyone that may not be feeling well and notify your surgeon if you develop symptoms. If you test positive for Covid or have been in contact with anyone that has tested positive in the last 10 days please notify you surgeon.  [facility] - Preparing for Surgery Before surgery, you can play an important role.  Because skin is not sterile, your skin needs to be as free of germs as possible.  You can reduce the number of germs on your skin by washing with CHG (chlorahexidine gluconate) soap before surgery.  CHG is an antiseptic cleaner which kills germs and bonds with the skin to continue killing germs even after washing. Please DO NOT use if you have an allergy to CHG or antibacterial soaps.  If  your skin becomes reddened/irritated stop using the CHG and inform your nurse when you arrive at Short Stay. Do not shave (including legs and underarms) for at least 48 hours prior to the first CHG shower.  You may shave your face/neck.  Please follow these instructions carefully:  1.  Shower with CHG Soap the night before surgery and the  morning of surgery.  2.  If you choose to wash your hair, wash your hair first as usual with your normal  shampoo.  3.  After you shampoo, rinse your hair and body thoroughly to remove the shampoo.                             4.  Use CHG as you would any other liquid soap.  You can apply chg directly to the skin and wash.  Gently with a scrungie or clean washcloth.  5.  Apply the CHG Soap to your body ONLY FROM THE NECK DOWN.   Do   not use on face/ open                           Wound or open sores. Avoid contact with eyes, ears mouth and   genitals (private parts).                       Wash face,  Genitals (private parts) with your normal soap.  6.  Wash thoroughly, paying special attention to the area where your    surgery  will be performed.  7.  Thoroughly rinse your body with warm water from the neck down.  8.  DO NOT shower/wash with your normal soap after using and rinsing off the CHG Soap.                9.  Pat yourself dry with a clean towel.            10.  Wear clean pajamas.            11.  Place clean sheets on your bed the night of your first shower and do not  sleep with pets. Day of Surgery : Do not apply any lotions/deodorants the morning of surgery.  Please wear clean clothes to the hospital/surgery center.  FAILURE TO FOLLOW THESE INSTRUCTIONS MAY RESULT IN THE CANCELLATION OF YOUR SURGERY  PATIENT SIGNATURE_________________________________  NURSE SIGNATURE__________________________________  ________________________________________________________________________

## 2023-03-16 ENCOUNTER — Encounter (HOSPITAL_COMMUNITY): Payer: Self-pay | Admitting: Surgery

## 2023-03-16 DIAGNOSIS — E210 Primary hyperparathyroidism: Secondary | ICD-10-CM | POA: Diagnosis present

## 2023-03-16 NOTE — H&P (Signed)
REFERRING PHYSICIAN: Lelan Pons, MD  PROVIDER: Sequoya Hogsett Myra Rude, MD   Chief Complaint: New Consultation (Primary hyperparathyroidism)  History of Present Illness:  Patient is referred by Dr. Dorisann Frames and Dr. Lupita Raider for surgical evaluation and management of primary hyperparathyroidism. Patient had been followed for several years with elevated serum calcium levels. Recent levels have ranged from 10.8-11.4. Intact PTH level was also at the upper range of normal ranging from 53-62. Patient underwent an ultrasound of the neck in March 2024. This showed a 1.1 cm soft tissue mass posterior to the left thyroid lobe consistent with parathyroid adenoma. This was performed at [facility]. Patient underwent a nuclear medicine parathyroid scan on Feb 22, 2023. This demonstrated evidence of a left-sided parathyroid adenoma which corresponded to the soft tissue nodule seen on ultrasound. Patient has not had any significant complications. He has had a bone density scan showing osteopenia. He denies any bone or joint pain. He denies fatigue. He has had no fractures. He does note urinary frequency. There is no family history of parathyroid disease or other endocrine neoplasm. Patient has had no prior head or neck surgery. He presents today accompanied by his wife to discuss parathyroid surgery.  Review of Systems: A complete review of systems was obtained from the patient. I have reviewed this information and discussed as appropriate with the patient. See HPI as well for other ROS.  Review of Systems Constitutional: Negative. Negative for malaise/fatigue. HENT: Negative. Eyes: Negative. Respiratory: Negative. Cardiovascular: Negative. Gastrointestinal: Negative. Genitourinary: Positive for frequency. Musculoskeletal: Negative. Negative for joint pain. Skin: Negative. Neurological: Negative. Endo/Heme/Allergies: Negative. Psychiatric/Behavioral: Negative.   Medical History: Past  Medical History: Diagnosis Date Arthritis GERD (gastroesophageal reflux disease) Hyperlipidemia  Patient Active Problem List Diagnosis Primary hyperparathyroidism (CMS/HHS-HCC)  Past Surgical History: Procedure Laterality Date CARDIAC CATH 05/02/2018 FINGER SURGERY 06/17/2021 COLONOSCOPY 2020, 2023   Allergies Allergen Reactions Sulfa (Sulfonamide Antibiotics) Rash and Swelling  Current Outpatient Medications on File Prior to Visit Medication Sig Dispense Refill aspirin 81 MG chewable tablet Take 81 mg by mouth once daily atorvastatin (LIPITOR) 40 MG tablet Take 40 mg by mouth once daily azelaic acid (FINACEA) 15 % topical gel APPLY SMALL AMOUNT TOPICALLY TO THE AFFECTED AREA TWICE DAILY azelastine (ASTELIN) 137 mcg nasal spray Place into one nostril colchicine (COLCRYS) 0.6 mg tablet Take 0.6 mg by mouth as needed desonide (DESOWEN) 0.05 % cream APPLY THIN FILM TO THE AFFECTED AREA AS NEEDED diazePAM (VALIUM) 10 MG tablet TAKE 1 TO 2 TABLETS BY MOUTH AS NEEDED FOR FLYING AND INSOMNIA doxylamine succinate (UNISOM) 25 mg tablet Take 25 mg by mouth at bedtime ergocalciferol, vitamin D2, 1,250 mcg (50,000 unit) capsule Take by mouth guaiFENesin (MUCINEX) 600 mg SR tablet Take by mouth L. acidophilus/Bifid. animalis 32 billion cell Cap Take by mouth loratadine (CLARITIN) 10 mg tablet Take 10 mg by mouth once daily meloxicam (MOBIC) 7.5 MG tablet Take 7.5 mg by mouth once daily multivitamin with minerals tablet Take 1 tablet by mouth once daily pseudoephedrine (SUDAFED 12 HR) 120 mg 12 hr tablet Take by mouth every 12 (twelve) hours RABEprazole (ACIPHEX) 20 mg EC tablet Take 20 mg by mouth once daily  No current facility-administered medications on file prior to visit.  History reviewed. No pertinent family history.  Social History  Tobacco Use Smoking Status Never Smokeless Tobacco Never   Social History  Socioeconomic History Marital status: Married Tobacco  Use Smoking status: Never Smokeless tobacco: Never Substance and Sexual Activity Alcohol  use: Yes Drug use: Never  Objective:  Vitals: BP: 116/64 Pulse: 73 Temp: 36.7 C (98.1 F) SpO2: 97% Weight: 84.8 kg (187 lb) Height: 182.9 cm (6') PainSc: 0-No pain PainLoc: Neck  Body mass index is 25.36 kg/m.  Physical Exam  GENERAL APPEARANCE Comfortable, no acute issues Development: normal Gross deformities: none  SKIN Rash, lesions, ulcers: none Induration, erythema: none Nodules: none palpable  EYES Conjunctiva and lids: normal Pupils: equal and reactive  EARS, NOSE, MOUTH, THROAT External ears: no lesion or deformity External nose: no lesion or deformity Hearing: grossly normal  NECK Symmetric: yes Trachea: midline Thyroid: no palpable nodules in the thyroid bed  ABDOMEN Not assessed  GENITOURINARY/RECTAL Not assessed  MUSCULOSKELETAL Station and gait: normal Digits and nails: no clubbing or cyanosis Muscle strength: grossly normal all extremities Range of motion: grossly normal all extremities Deformity: none  LYMPHATIC Cervical: none palpable Supraclavicular: none palpable  PSYCHIATRIC Oriented to person, place, and time: yes Mood and affect: normal for situation Judgment and insight: appropriate for situation   Assessment and Plan:  Primary hyperparathyroidism (CMS/HHS-HCC)  Patient is referred by his endocrinologist and primary care physician for surgical evaluation and management of primary hyperparathyroidism.  Patient provided with a copy of "Parathyroid Surgery: Treatment for Your Parathyroid Gland Problem", published by Krames, 12 pages. Book reviewed and explained to patient during visit today.  Today we reviewed his clinical history. We reviewed his imaging studies including the ultrasound report as well as a nuclear medicine parathyroid scan. We reviewed his recent laboratory studies. Patient has evidence of primary  hyperparathyroidism with a left-sided parathyroid adenoma. Today we discussed minimally invasive parathyroidectomy. We discussed doing this as an outpatient surgery. We discussed risk and benefits of the procedure including the risk of recurrent laryngeal nerve injury. We discussed the size and location of the incision. We discussed the postoperative recovery to be anticipated. We discussed follow-up laboratory testing after the procedure. The patient understands and wishes to proceed with surgery in the near future.  Darnell Level, MD [facility] practice Office: [phone]

## 2023-03-20 NOTE — Progress Notes (Signed)
Dr Gerrit Friends requested [phone] start for patinets surgery tomrorow.  Patient contacted to arrive at 0515 for 0730 start.  Patient stated he was able to come in at that time

## 2023-03-20 NOTE — Anesthesia Preprocedure Evaluation (Signed)
Anesthesia Evaluation  Patient identified by MRN, date of birth, ID band Patient awake    Reviewed: Allergy & Precautions, NPO status , Patient's Chart, lab work & pertinent test results  History of Anesthesia Complications Negative for: history of anesthetic complications  Airway Mallampati: I  TM Distance: >3 FB Neck ROM: Full    Dental  (+) Dental Advisory Given,    Pulmonary neg pulmonary ROS   Pulmonary exam normal breath sounds clear to auscultation       Cardiovascular (-) hypertension(-) angina (-) Past MI, (-) Cardiac Stents and (-) CABG + dysrhythmias (Mobitz type I AV block)  Rhythm:Regular Rate:Normal  HLD  LHC 05/02/2018:  Prox RCA to Mid RCA lesion is 25% stenosed.   Mild nonobstructive coronary artery disease with smooth 20 to 30% eccentric narrowing in the proximal to mid RCA and a dominant RCA system.   Normal left coronary system.   Normal LV function without focal segmental wall motion abnormalities.  LVEDP 11 mm.    Neuro/Psych negative neurological ROS     GI/Hepatic Neg liver ROS,GERD  ,,  Endo/Other  neg diabetes  hyperparathyroidism  Renal/GU negative Renal ROS     Musculoskeletal  (+) Arthritis ,  osteopenia   Abdominal   Peds  Hematology negative hematology ROS (+)   Anesthesia Other Findings Reports neck pain. Discussed with patient that he could position his neck comfortably before going to sleep and that we would not move it for intubation but that the surgeon may need to move it for surgery.  Reproductive/Obstetrics                             Anesthesia Physical Anesthesia Plan  ASA: 3  Anesthesia Plan: General   Post-op Pain Management: Tylenol PO (pre-op)*   Induction: Intravenous  PONV Risk Score and Plan: 2 and Ondansetron, Dexamethasone and Treatment may vary due to age or medical condition  Airway Management Planned: Oral  ETT  Additional Equipment:   Intra-op Plan:   Post-operative Plan: Extubation in OR  Informed Consent: I have reviewed the patients History and Physical, chart, labs and discussed the procedure including the risks, benefits and alternatives for the proposed anesthesia with the patient or authorized representative who has indicated his/her understanding and acceptance.     Dental advisory given  Plan Discussed with: CRNA and Anesthesiologist  Anesthesia Plan Comments: (Risks of general anesthesia discussed including, but not limited to, sore throat, hoarse voice, chipped/damaged teeth, injury to vocal cords, nausea and vomiting, allergic reactions, lung infection, heart attack, stroke, and death. All questions answered. )       Anesthesia Quick Evaluation

## 2023-03-21 ENCOUNTER — Other Ambulatory Visit: Payer: Self-pay

## 2023-03-21 ENCOUNTER — Encounter (HOSPITAL_COMMUNITY)
Admission: RE | Disposition: A | Discharge status: Home / Self Care | Payer: Self-pay | Source: Home / Self Care | Attending: Surgery

## 2023-03-21 ENCOUNTER — Encounter (HOSPITAL_COMMUNITY): Payer: Self-pay | Admitting: Surgery

## 2023-03-21 ENCOUNTER — Ambulatory Visit (HOSPITAL_BASED_OUTPATIENT_CLINIC_OR_DEPARTMENT_OTHER): Payer: Medicare Other | Admitting: Anesthesiology

## 2023-03-21 ENCOUNTER — Ambulatory Visit (HOSPITAL_COMMUNITY): Payer: Medicare Other | Admitting: Physician Assistant

## 2023-03-21 ENCOUNTER — Ambulatory Visit (HOSPITAL_COMMUNITY)
Admission: RE | Admit: 2023-03-21 | Discharge: 2023-03-21 | Disposition: A | Discharge status: Home / Self Care | Payer: Medicare Other | Attending: Surgery | Admitting: Surgery

## 2023-03-21 DIAGNOSIS — E210 Primary hyperparathyroidism: Secondary | ICD-10-CM

## 2023-03-21 DIAGNOSIS — E785 Hyperlipidemia, unspecified: Secondary | ICD-10-CM

## 2023-03-21 DIAGNOSIS — I440 Atrioventricular block, first degree: Secondary | ICD-10-CM | POA: Diagnosis not present

## 2023-03-21 DIAGNOSIS — D351 Benign neoplasm of parathyroid gland: Secondary | ICD-10-CM | POA: Insufficient documentation

## 2023-03-21 DIAGNOSIS — I2510 Atherosclerotic heart disease of native coronary artery without angina pectoris: Secondary | ICD-10-CM

## 2023-03-21 HISTORY — PX: PARATHYROIDECTOMY: SHX19

## 2023-03-21 SURGERY — PARATHYROIDECTOMY
Anesthesia: General

## 2023-03-21 MED ORDER — FENTANYL CITRATE (PF) 100 MCG/2ML IJ SOLN
INTRAMUSCULAR | Status: DC | PRN
  Administered 2023-03-21: 50 ug via INTRAVENOUS

## 2023-03-21 MED ORDER — ONDANSETRON HCL 4 MG/2ML IJ SOLN
INTRAMUSCULAR | Status: DC | PRN
  Administered 2023-03-21: 4 mg via INTRAVENOUS

## 2023-03-21 MED ORDER — PROPOFOL 10 MG/ML IV BOLUS
INTRAVENOUS | Status: DC | PRN
  Administered 2023-03-21: 130 mg via INTRAVENOUS

## 2023-03-21 MED ORDER — SUGAMMADEX SODIUM 200 MG/2ML IV SOLN
INTRAVENOUS | Status: DC | PRN
  Administered 2023-03-21: 200 mg via INTRAVENOUS

## 2023-03-21 MED ORDER — LIDOCAINE HCL (PF) 2 % IJ SOLN
INTRAMUSCULAR | Status: AC
  Filled 2023-03-21: qty 5

## 2023-03-21 MED ORDER — ACETAMINOPHEN 500 MG PO TABS
1000.0000 mg | ORAL_TABLET | Freq: Once | ORAL | Status: AC
  Administered 2023-03-21: 1000 mg via ORAL
  Filled 2023-03-21: qty 2

## 2023-03-21 MED ORDER — OXYCODONE HCL 5 MG/5ML PO SOLN: 5.0000 mg | Freq: Once | ORAL | Status: DC | PRN

## 2023-03-21 MED ORDER — ROCURONIUM BROMIDE 10 MG/ML (PF) SYRINGE
PREFILLED_SYRINGE | INTRAVENOUS | Status: DC | PRN
  Administered 2023-03-21: 60 mg via INTRAVENOUS

## 2023-03-21 MED ORDER — 0.9 % SODIUM CHLORIDE (POUR BTL) OPTIME
TOPICAL | Status: DC | PRN
  Administered 2023-03-21: 1000 mL

## 2023-03-21 MED ORDER — FENTANYL CITRATE PF 50 MCG/ML IJ SOSY: 25.0000 ug | PREFILLED_SYRINGE | INTRAMUSCULAR | Status: DC | PRN

## 2023-03-21 MED ORDER — HEMOSTATIC AGENTS (NO CHARGE) OPTIME
TOPICAL | Status: DC | PRN
  Administered 2023-03-21: 1 via TOPICAL

## 2023-03-21 MED ORDER — FENTANYL CITRATE (PF) 100 MCG/2ML IJ SOLN
INTRAMUSCULAR | Status: AC
  Filled 2023-03-21: qty 2

## 2023-03-21 MED ORDER — CHLORHEXIDINE GLUCONATE 0.12 % MT SOLN
15.0000 mL | Freq: Once | OROMUCOSAL | Status: AC
  Administered 2023-03-21: 15 mL via OROMUCOSAL

## 2023-03-21 MED ORDER — ORAL CARE MOUTH RINSE: 15.0000 mL | Freq: Once | OROMUCOSAL | Status: AC

## 2023-03-21 MED ORDER — BUPIVACAINE HCL (PF) 0.25 % IJ SOLN
INTRAMUSCULAR | Status: AC
  Filled 2023-03-21: qty 30

## 2023-03-21 MED ORDER — BUPIVACAINE HCL 0.25 % IJ SOLN
INTRAMUSCULAR | Status: DC | PRN
  Administered 2023-03-21: 10 mL

## 2023-03-21 MED ORDER — CEFAZOLIN SODIUM-DEXTROSE 2-4 GM/100ML-% IV SOLN
2.0000 g | INTRAVENOUS | Status: AC
  Administered 2023-03-21: 2 g via INTRAVENOUS
  Filled 2023-03-21: qty 100

## 2023-03-21 MED ORDER — PROPOFOL 10 MG/ML IV BOLUS
INTRAVENOUS | Status: AC
  Filled 2023-03-21: qty 20

## 2023-03-21 MED ORDER — DEXAMETHASONE SODIUM PHOSPHATE 10 MG/ML IJ SOLN
INTRAMUSCULAR | Status: AC
  Filled 2023-03-21: qty 1

## 2023-03-21 MED ORDER — AMISULPRIDE (ANTIEMETIC) 5 MG/2ML IV SOLN: 10.0000 mg | Freq: Once | INTRAVENOUS | Status: DC | PRN

## 2023-03-21 MED ORDER — MIDAZOLAM HCL 2 MG/2ML IJ SOLN
INTRAMUSCULAR | Status: AC
  Filled 2023-03-21: qty 2

## 2023-03-21 MED ORDER — LIDOCAINE 2% (20 MG/ML) 5 ML SYRINGE
INTRAMUSCULAR | Status: DC | PRN
  Administered 2023-03-21: 60 mg via INTRAVENOUS

## 2023-03-21 MED ORDER — EPHEDRINE 5 MG/ML INJ
INTRAVENOUS | Status: AC
  Filled 2023-03-21: qty 5

## 2023-03-21 MED ORDER — LACTATED RINGERS IV SOLN: INTRAVENOUS | Status: DC

## 2023-03-21 MED ORDER — EPHEDRINE SULFATE (PRESSORS) 50 MG/ML IJ SOLN
INTRAMUSCULAR | Status: DC | PRN
  Administered 2023-03-21: 10 mg via INTRAVENOUS

## 2023-03-21 MED ORDER — CHLORHEXIDINE GLUCONATE CLOTH 2 % EX PADS: 6.0000 | MEDICATED_PAD | Freq: Once | CUTANEOUS | Status: DC

## 2023-03-21 MED ORDER — OXYCODONE HCL 5 MG PO TABS: 5.0000 mg | ORAL_TABLET | Freq: Once | ORAL | Status: DC | PRN

## 2023-03-21 MED ORDER — ONDANSETRON HCL 4 MG/2ML IJ SOLN
INTRAMUSCULAR | Status: AC
  Filled 2023-03-21: qty 2

## 2023-03-21 MED ORDER — MIDAZOLAM HCL 2 MG/2ML IJ SOLN
INTRAMUSCULAR | Status: DC | PRN
  Administered 2023-03-21: 2 mg via INTRAVENOUS

## 2023-03-21 MED ORDER — ROCURONIUM BROMIDE 10 MG/ML (PF) SYRINGE
PREFILLED_SYRINGE | INTRAVENOUS | Status: AC
  Filled 2023-03-21: qty 10

## 2023-03-21 MED ORDER — DEXAMETHASONE SODIUM PHOSPHATE 10 MG/ML IJ SOLN
INTRAMUSCULAR | Status: DC | PRN
  Administered 2023-03-21: 8 mg via INTRAVENOUS

## 2023-03-21 MED ORDER — TRAMADOL HCL 50 MG PO TABS: 50.0000 mg | ORAL_TABLET | Freq: Four times a day (QID) | ORAL | 0 refills | Status: DC | PRN

## 2023-03-21 SURGICAL SUPPLY — 36 items
ADH SKN CLS APL DERMABOND .7 (GAUZE/BANDAGES/DRESSINGS) ×1
ADH SKNCLS APL OCTYL .7 VIOL (GAUZE/BANDAGES/DRESSINGS) ×1
APL PRP STRL LF DISP 70% ISPRP (MISCELLANEOUS) ×1
ATTRACTOMAT 16X20 MAGNETIC DRP (DRAPES) ×1 IMPLANT
BAG COUNTER SPONGE SURGICOUNT (BAG) ×1 IMPLANT
BAG SPNG CNTER NS LX DISP (BAG) ×1
BLADE SURG 15 STRL LF DISP TIS (BLADE) ×1 IMPLANT
BLADE SURG 15 STRL SS (BLADE) ×1
CHLORAPREP W/TINT 26 (MISCELLANEOUS) ×1 IMPLANT
CLIP TI MEDIUM 6 (CLIP) ×2 IMPLANT
CLIP TI WIDE RED SMALL 6 (CLIP) ×2 IMPLANT
COVER SURGICAL LIGHT HANDLE (MISCELLANEOUS) ×1 IMPLANT
DERMABOND ADVANCED .7 DNX12 (GAUZE/BANDAGES/DRESSINGS) ×1 IMPLANT
DRAPE LAPAROTOMY T 98X78 PEDS (DRAPES) ×1 IMPLANT
DRAPE UTILITY XL STRL (DRAPES) ×1 IMPLANT
ELECT REM PT RETURN 15FT ADLT (MISCELLANEOUS) ×1 IMPLANT
GAUZE 4X4 16PLY ~~LOC~~+RFID DBL (SPONGE) ×1 IMPLANT
GLOVE SURG ORTHO 8.0 STRL STRW (GLOVE) ×1 IMPLANT
GOWN STRL REUS W/ TWL XL LVL3 (GOWN DISPOSABLE) ×3 IMPLANT
GOWN STRL REUS W/TWL XL LVL3 (GOWN DISPOSABLE) ×3
HEMOSTAT SURGICEL 2X4 FIBR (HEMOSTASIS) ×1 IMPLANT
ILLUMINATOR WAVEGUIDE N/F (MISCELLANEOUS) IMPLANT
KIT BASIN OR (CUSTOM PROCEDURE TRAY) ×1 IMPLANT
KIT TURNOVER KIT A (KITS) IMPLANT
NDL HYPO 25X1 1.5 SAFETY (NEEDLE) ×1 IMPLANT
NEEDLE HYPO 25X1 1.5 SAFETY (NEEDLE) ×1 IMPLANT
PACK BASIC VI WITH GOWN DISP (CUSTOM PROCEDURE TRAY) ×1 IMPLANT
PENCIL SMOKE EVACUATOR (MISCELLANEOUS) ×1 IMPLANT
SHEARS HARMONIC 9CM CVD (BLADE) IMPLANT
SUT MNCRL AB 4-0 PS2 18 (SUTURE) ×1 IMPLANT
SUT VIC AB 3-0 SH 18 (SUTURE) ×1 IMPLANT
SYR BULB IRRIG 60ML STRL (SYRINGE) ×1 IMPLANT
SYR CONTROL 10ML LL (SYRINGE) ×1 IMPLANT
TOWEL OR 17X26 10 PK STRL BLUE (TOWEL DISPOSABLE) ×1 IMPLANT
TOWEL OR NON WOVEN STRL DISP B (DISPOSABLE) ×1 IMPLANT
TUBING CONNECTING 10 (TUBING) ×1 IMPLANT

## 2023-03-21 NOTE — Interval H&P Note (Signed)
History and Physical Interval Note:  03/21/2023 7:12 AM  Daniel Wong  has presented today for surgery, with the diagnosis of PRIMARY HYPERPARATHYROIDISM.  The various methods of treatment have been discussed with the patient and family. After consideration of risks, benefits and other options for treatment, the patient has consented to    Procedure(s): LEFT PARATHYROIDECTOMY (N/A) as a surgical intervention.    The patient's history has been reviewed, patient examined, no change in status, stable for surgery.  I have reviewed the patient's chart and labs.  Questions were answered to the patient's satisfaction.    Darnell Level, MD [facility] A [facility] practice Office: [phone]   Darnell Level

## 2023-03-21 NOTE — Anesthesia Procedure Notes (Signed)
Procedure Name: Intubation Date/Time: 03/21/2023 7:37 AM  Performed by: Nelle Don, CRNAPre-anesthesia Checklist: Patient identified, Emergency Drugs available, Suction available and Patient being monitored Patient Re-evaluated:Patient Re-evaluated prior to induction Oxygen Delivery Method: Circle system utilized Preoxygenation: Pre-oxygenation with 100% oxygen Induction Type: IV induction Ventilation: Mask ventilation without difficulty Laryngoscope Size: Mac and 4 Grade View: Grade I Tube type: Oral Tube size: 7.5 mm Number of attempts: 1 Airway Equipment and Method: Stylet Placement Confirmation: ETT inserted through vocal cords under direct vision, positive ETCO2 and breath sounds checked- equal and bilateral Secured at: 23 cm Tube secured with: Tape Dental Injury: Teeth and Oropharynx as per pre-operative assessment

## 2023-03-21 NOTE — Discharge Instructions (Addendum)
[facility] - Dr. Todd Gerkin  THYROID & PARATHYROID SURGERY:  POST-OP INSTRUCTIONS  Always review the instruction sheet provided by the hospital nurse at discharge.  A prescription for pain medication may be sent to your pharmacy at the time of discharge.  Take your pain medication as prescribed.  If narcotic pain medicine is not needed, then you may take acetaminophen (Tylenol) or ibuprofen (Advil) as needed for pain or soreness.  Take your normal home medications as prescribed unless otherwise directed.  If you need a refill on your pain medication, please contact the office during regular business hours.  Prescriptions will not be processed by the office after 5:00PM or on weekends.  Start with a light diet upon arrival home, such as soup and crackers or toast.  Be sure to drink plenty of fluids.  Resume your normal diet the day after surgery.  Most patients will experience some swelling and bruising on the chest and neck area.  Ice packs will help for the first 48 hours after arriving home.  Swelling and bruising will take several days to resolve.   It is common to experience some constipation after surgery.  Increasing fluid intake and taking a stool softener (Colace) will usually help to prevent this problem.  A mild laxative (Milk of Magnesia or Miralax) should be taken according to package directions if there has been no bowel movement after 48 hours.  Dermabond glue covers your incision. This seals the wound and you may shower at any time. The Dermabond will remain in place for about a week.  You may gradually remove the glue when it loosens around the edges.  If you need to loosen the Dermabond for removal, apply a layer of Vaseline to the wound for 15 minutes and then remove with a Kleenex. Your sutures are under the skin and will not show - they will dissolve on their own.  You may resume light daily activities beginning the day after discharge (such as self-care,  walking, climbing stairs), gradually increasing activities as tolerated. You may have sexual intercourse when it is comfortable. Refrain from any heavy lifting or straining until approved by your doctor. You may drive when you no longer are taking prescription pain medication, you can comfortably wear a seatbelt, and you can safely maneuver your car and apply the brakes.  You will see your doctor in the office for a follow-up appointment approximately three weeks after your surgery.  Make sure that you call for this appointment within a day or two after you arrive home to insure a convenient appointment time. Please have any requested laboratory tests performed a few days prior to your office visit so that the results will be available at your follow up appointment.  WHEN TO CALL THE [facility] OFFICE: -- Fever greater than 101.5 -- Inability to urinate -- Nausea and/or vomiting - persistent -- Extreme swelling or bruising -- Continued bleeding from incision -- Increased pain, redness, or drainage from the incision -- Difficulty swallowing or breathing -- Muscle cramping or spasms -- Numbness or tingling in hands or around lips  The clinic staff is available to answer your questions during regular business hours.  Please don't hesitate to call and ask to speak to one of the nurses if you have concerns.  [facility] OFFICE: [phone] (24 hours)  Please sign up for MyChart accounts. This will allow you to communicate directly with my nurse or myself without having to call the office. It will also allow you   to view your test results. You will need to enroll in MyChart for my office ([facility]) and for the hospital ([facility]).  Todd Gerkin, MD [facility] A [facility] practice 

## 2023-03-21 NOTE — Anesthesia Postprocedure Evaluation (Signed)
Anesthesia Post Note  Patient: Daniel Wong  Procedure(s) Performed: LEFT PARATHYROIDECTOMY     Patient location during evaluation: PACU Anesthesia Type: General Level of consciousness: awake Pain management: pain level controlled Vital Signs Assessment: post-procedure vital signs reviewed and stable Respiratory status: spontaneous breathing, nonlabored ventilation and respiratory function stable Cardiovascular status: blood pressure returned to baseline and stable Postop Assessment: no apparent nausea or vomiting Anesthetic complications: no   No notable events documented.  Last Vitals:  Vitals:   03/21/23 0915 03/21/23 0930  BP: 119/79 112/84  Pulse: 69 61  Resp: 17   Temp:  (!) 36.3 C  SpO2: 99% 99%    Last Pain:  Vitals:   03/21/23 0930  TempSrc: Oral  PainSc: 0-No pain                 Linton Rump

## 2023-03-21 NOTE — Transfer of Care (Signed)
Immediate Anesthesia Transfer of Care Note  Patient: Daniel Wong  Procedure(s) Performed: LEFT PARATHYROIDECTOMY  Patient Location: PACU  Anesthesia Type:General  Level of Consciousness: drowsy  Airway & Oxygen Therapy: Patient Spontanous Breathing and Patient connected to face mask oxygen  Post-op Assessment: Report given to RN, Post -op Vital signs reviewed and stable, and Patient moving all extremities X 4  Post vital signs: Reviewed and stable  Last Vitals:  Vitals Value Taken Time  BP 121/76 03/21/23 0840  Temp    Pulse 49 03/21/23 0842  Resp 12 03/21/23 0842  SpO2 100 % 03/21/23 0842  Vitals shown include unvalidated device data.  Last Pain:  Vitals:   03/21/23 0559  TempSrc: Oral  PainSc: 0-No pain         Complications: No notable events documented.

## 2023-03-21 NOTE — Op Note (Signed)
OPERATIVE REPORT - PARATHYROIDECTOMY  Preoperative diagnosis: Primary hyperparathyroidism  Postop diagnosis: Same  Procedure: Left superior minimally invasive parathyroidectomy  Surgeon:  Darnell Level, MD  Anesthesia: General endotracheal  Estimated blood loss: Minimal  Preparation: ChloraPrep  Indications: Patient is referred by Dr. Dorisann Frames and Dr. Lupita Raider for surgical evaluation and management of primary hyperparathyroidism. Patient had been followed for several years with elevated serum calcium levels. Recent levels have ranged from 10.8-11.4. Intact PTH level was also at the upper range of normal ranging from 53-62. Patient underwent an ultrasound of the neck in March 2024. This showed a 1.1 cm soft tissue mass posterior to the left thyroid lobe consistent with parathyroid adenoma. This was performed at [facility]. Patient underwent a nuclear medicine parathyroid scan on Feb 22, 2023. This demonstrated evidence of a left-sided parathyroid adenoma which corresponded to the soft tissue nodule seen on ultrasound. Patient has not had any significant complications. He has had a bone density scan showing osteopenia. He denies any bone or joint pain. He denies fatigue. He has had no fractures. He does note urinary frequency. There is no family history of parathyroid disease or other endocrine neoplasm. Patient has had no prior head or neck surgery. He presents today accompanied by his wife to discuss parathyroid surgery.   Procedure: The patient was prepared in the pre-operative holding area. The patient was brought to the operating room and placed in a supine position on the operating room table. Following administration of general anesthesia, the patient was positioned and then prepped and draped in the usual strict aseptic fashion. After ascertaining that an adequate level of anesthesia been achieved, a neck incision was made with a #15 blade. Dissection was carried through subcutaneous  tissues and platysma. Hemostasis was obtained with the electrocautery. Skin flaps were developed circumferentially and a Weitlander retractor was placed for exposure.  Strap muscles were incised in the midline. Strap muscles were reflected laterally exposing the thyroid lobe. With gentle blunt dissection the thyroid lobe was mobilized.  Dissection was carried posteriorly and an enlarged parathyroid gland was identified. It was gently mobilized. Vascular structures were divided between small ligaclips. Care was taken to avoid the recurrent laryngeal nerve. The parathyroid gland was completely excised. It was submitted to pathology where frozen section confirmed hypercellular parathyroid tissue consistent with adenoma.  Neck was irrigated with warm saline and good hemostasis was noted. Fibrillar was placed in the operative field. Strap muscles were approximated in the midline with interrupted 3-0 Vicryl sutures. Platysma was closed with interrupted 3-0 Vicryl sutures. Marcaine was infiltrated circumferentially. Skin was closed with a running 4-0 Monocryl subcuticular suture. Wound was washed and dried and Dermabond was applied. Patient was awakened from anesthesia and brought to the recovery room. The patient tolerated the procedure well.   Darnell Level, MD [facility] Office: [phone]

## 2023-03-22 ENCOUNTER — Encounter (HOSPITAL_COMMUNITY): Payer: Self-pay | Admitting: Surgery

## 2023-03-22 LAB — SURGICAL PATHOLOGY

## 2023-03-28 NOTE — Progress Notes (Signed)
     This is a parathyroid adenoma.  Coding should address this with pathology - it is really their issue.  Darnell Level, MD [facility] practice Office: [phone]

## 2023-06-07 LAB — LAB REPORT - SCANNED: EGFR: 72

## 2023-08-18 NOTE — Progress Notes (Unsigned)
Cardiology Clinic Note   Patient Name: Daniel Wong Date of Encounter: 08/18/2023  Primary Care Provider:  Lupita Raider, MD Primary Cardiologist:  Thurmon Fair, MD  Patient Profile    Daniel Wong 67 year old male presents to the clinic today for follow-up evaluation of his chest pain and SA block.  Past Medical History    Past Medical History:  Diagnosis Date   Allergic rhinitis    Arthritis    "some in [facility] joints in my shoulders" (05/02/2018)   Bronchial pneumonia ~ 1966   GERD (gastroesophageal reflux disease)    Gout    High cholesterol    History of gout    "tx'd prn RX" (05/02/2018)   Hypercalcemia    Hyperparathyroidism (HCC)    Osteopenia    PSA elevation    Rosacea    Past Surgical History:  Procedure Laterality Date   CARDIAC CATHETERIZATION  05/02/2018   COLONOSCOPY W/ BIOPSIES AND POLYPECTOMY  2014   "no issues"   ESOPHAGOGASTRODUODENOSCOPY  ~ 2005   FINGER SURGERY     Due to gout   LEFT HEART CATH AND CORONARY ANGIOGRAPHY N/A 05/02/2018   Procedure: LEFT HEART CATH AND CORONARY ANGIOGRAPHY;  Surgeon: Lennette Bihari, MD;  [facility]: [facility];  Service: Cardiovascular;  Laterality: N/A;   PARATHYROIDECTOMY N/A 03/21/2023   Procedure: LEFT PARATHYROIDECTOMY;  Surgeon: Darnell Level, MD;  Location: [facility];  Service: General;  Laterality: N/A;   TONSILLECTOMY  1960s    Allergies  Allergies  Allergen Reactions   Sulfa Antibiotics Hives    History of Present Illness    Daniel Wong has a PMH of hyperlipidemia, hypercalcemia, osteopenia, chest discomfort, and SA block.  In 2019 he had a transient episode of chest discomfort after eating breakfast quickly.  His EKG showed STEMI.  He underwent cardiac catheterization which showed moderate plaque.  It was felt that his symptoms were due to esophageal spasm.  His EKG has continued to show nonspecific inferior ST segment elevation with elevated J-point which is typical of early  repolarization  He was  seen by cardiology after referral  for evaluation of heart block that was disconnected during colonoscopy.  He had been sedated with propofol.  He had been previously seen by cardiology in 2019.  He was seen in follow-up by Dr. Royann Shivers on 08/19/2022.  During that time he denied presyncope and syncope.  He denied fatigue.  He continued to exercise with a personal trainer 2-3 times per week.  He was walking 30 minutes 4-4.2 mph pace.  He would do this 4 to 5 days/week.  He denied palpitations.  He denied psychosocial dyspnea, angina, orthopnea, PND.  He denied lower extremity claudication.  He did note some lightheadedness which was brief when he would bend over and stand up quickly.  His blood pressure was 114/70.  He presents to the clinic today for follow-up evaluation and states***.  *** denies chest pain, shortness of breath, lower extremity edema, fatigue, palpitations, melena, hematuria, hemoptysis, diaphoresis, weakness, presyncope, syncope, orthopnea, and PND.  Second-degree SA block Mobitz type I-EKG today shows***.  Noted to have SA block during colonoscopy procedure.  This was felt to be due to high vagal tone.  He wore a cardiac event monitor 11/23 which showed rhythm changes similar to the one seen in the office but no serious slowing of his heart rate and no long pauses.  Continued monitoring was recommended.  Details above.  Avoid AV nodal  blocking agents Continue to monitor-contact office with episodes of lightheadedness, presyncope or syncope.  Hyperlipidemia-LDL***.  LDL goal less than 100 High-fiber diet Increase physical activity as tolerated Continue statin therapy Repeat fasting lipids and LFTs   Coronary artery disease-denies recent episodes of arm neck back or chest discomfort.  Underwent cardiac catheterization in 2019.  Nonobstructive plaque was noted at that time. Heart healthy low-sodium diet Continue aspirin,  atorvastatin  Hyperparathyroidism-calcium 10.7 on 03/15/2023 Follows with PCP  Disposition: Follow-up with Dr. Royann Shivers or me in 12 months.  Home Medications    Prior to Admission medications   Medication Sig Start Date End Date Taking? Authorizing Provider  aspirin 81 MG tablet Take 81 mg by mouth daily.    [provider]  atorvastatin (LIPITOR) 40 MG tablet Take 40 mg by mouth daily. 04/13/18   [provider]  Azelaic Acid (FINACEA) 15 % cream Apply 15 % topically as needed. After skin is thoroughly washed and patted dry, gently but thoroughly massage a thin film of azelaic acid cream into the affected area twice daily, in the morning and evening.    [provider]  azelastine (ASTELIN) 0.1 % nasal spray Place 2 sprays into both nostrils daily as needed for rhinitis or allergies. Use in each nostril as directed    [provider]  Cholecalciferol (VITAMIN D-3) 125 MCG (5000 UT) TABS Take 5,000 Units by mouth daily.    [provider]  colchicine 0.6 MG tablet Take 0.6 mg by mouth 2 (two) times daily as needed for pain (gout). 04/09/18   [provider]  desonide (DESOWEN) 0.05 % cream Apply 1 application  topically daily as needed (rash in ears).    [provider]  diazepam (VALIUM) 10 MG tablet Take 10-20 mg by mouth as needed for anxiety (for flying).    [provider]  doxylamine, Sleep, (UNISOM) 25 MG tablet Take 25 mg by mouth at bedtime.     [provider]  guaiFENesin (MUCINEX) 600 MG 12 hr tablet Take 600 mg by mouth 2 (two) times daily as needed for cough or to loosen phlegm.    [provider]  hydrocortisone (ANUSOL-HC) 2.5 % rectal cream Apply 1 Application topically 2 (two) times daily as needed for hemorrhoids or anal itching. 02/27/23   [provider]  ketoconazole (NIZORAL) 2 % cream Apply 1 Application topically daily as needed for irritation.    [provider]   loratadine (CLARITIN) 10 MG tablet Take 10 mg by mouth daily.    [provider]  Multiple Vitamins-Minerals (MULTIVITAMIN MEN) TABS Take 1 tablet by mouth daily.     [provider]  Probiotic Product (PHILLIPS COLON HEALTH) CAPS Take 1 capsule by mouth daily in the afternoon. 06/10/21   [provider]  pseudoephedrine (SUDAFED) 120 MG 12 hr tablet Take 120 mg by mouth every 12 (twelve) hours as needed for congestion.    [provider]  RABEprazole (ACIPHEX) 20 MG tablet Take 20 mg by mouth daily.    [provider]  sodium chloride (OCEAN) 0.65 % SOLN nasal spray Place 1 spray into both nostrils as needed for congestion.    [provider]  traMADol (ULTRAM) 50 MG tablet Take 1 tablet (50 mg total) by mouth every 6 (six) hours as needed for moderate pain. 03/21/23   Darnell Level, MD  triamcinolone ointment (KENALOG) 0.1 % Apply 1 Application topically 2 (two) times daily as needed (irritation). 02/27/23  [provider]    Family History    Family History  Problem Relation Age of Onset   Multiple myeloma Mother    Prostate cancer Father    Hypercalcemia Neg Hx    He indicated that his mother is deceased. He indicated that his father is deceased. He indicated that the status of his neg hx is unknown.  Social History    Social History   Socioeconomic History   Marital status: Married    Spouse name: Not on file   Number of children: Not on file   Years of education: Not on file   Highest education level: Not on file  Occupational History   Not on file  Tobacco Use   Smoking status: Never   Smokeless tobacco: Never  Vaping Use   Vaping status: Never Used  Substance and Sexual Activity   Alcohol use: Yes    Alcohol/week: 14.0 standard drinks of alcohol    Types: 14 Glasses of wine per week   Drug use: Never   Sexual activity: Yes  Other Topics Concern   Not on file  Social History Narrative   Not on file    Social Determinants of Health   Financial Resource Strain: Not on file  Food Insecurity: Not on file  Transportation Needs: Not on file  Physical Activity: Not on file  Stress: Not on file  Social Connections: Not on file  Intimate Partner Violence: Not on file     Review of Systems    General:  No chills, fever, night sweats or weight changes.  Cardiovascular:  No chest pain, dyspnea on exertion, edema, orthopnea, palpitations, paroxysmal nocturnal dyspnea. Dermatological: No rash, lesions/masses Respiratory: No cough, dyspnea Urologic: No hematuria, dysuria Abdominal:   No nausea, vomiting, diarrhea, bright red blood per rectum, melena, or hematemesis Neurologic:  No visual changes, wkns, changes in mental status. All other systems reviewed and are otherwise negative except as noted above.  Physical Exam    VS:  There were no vitals taken for this visit. , BMI There is no height or weight on file to calculate BMI. GEN: Well nourished, well developed, in no acute distress. HEENT: normal. Neck: Supple, no JVD, carotid bruits, or masses. Cardiac: RRR, no murmurs, rubs, or gallops. No clubbing, cyanosis, edema.  Radials/DP/PT 2+ and equal bilaterally.  Respiratory:  Respirations regular and unlabored, clear to auscultation bilaterally. GI: Soft, nontender, nondistended, BS + x 4. MS: no deformity or atrophy. Skin: warm and dry, no rash. Neuro:  Strength and sensation are intact. Psych: Normal affect.  Accessory Clinical Findings    Recent Labs: 03/15/2023: BUN 25; Creatinine, Ser 1.14; Potassium 4.1; Sodium 139   Recent Lipid Panel    Component Value Date/Time   CHOL 154 05/02/2018 1622   TRIG 75 05/02/2018 1622   HDL 52 05/02/2018 1622   CHOLHDL 3.0 05/02/2018 1622   VLDL 15 05/02/2018 1622   LDLCALC 87 05/02/2018 1622    No BP recorded.  {Refresh Note OR Click here to enter BP  :1}***    ECG personally reviewed by me today- ***     Cardiac catheterization  05/02/2018   Prox RCA to Mid RCA lesion is 25% stenosed.   Mild nonobstructive coronary artery disease with smooth 20 to 30% eccentric narrowing in the proximal to mid RCA and a dominant RCA system.   Normal left coronary system.   Normal LV function without focal segmental wall motion abnormalities.  LVEDP 11 mm.  RECOMMENDATION: Medical therapy.  The patient will undergo a 2D echo Doppler study to assess for possible pericardial abnormality.  At the completion of the procedure the patient appeared comfortable but still had mild residual 3 out of 10 chest pain he describes seems to be worse with taking a deep breath.  A subsequent ECG showed improvement in his J-point elevation and was interpreted as possible early repolarization changes.   Recommend Aspirin 81mg  daily for moderate CAD.  The patient will continue with high potency statin therapy and antireflux therapy.   Echocardiogram 05/03/2018    - Left ventricle: The cavity size was normal. Systolic function was    normal. The estimated ejection fraction was in the range of 60%    to 65%. Wall motion was normal; there were no regional wall    motion abnormalities.  - Aortic valve: Transvalvular velocity was within the normal range.    There was no stenosis. There was mild regurgitation. Valve area    (VTI): 3.07 cm^2. Valve area (Vmax): 2.83 cm^2. Valve area    (Vmean): 2.96 cm^2.  - Mitral valve: Transvalvular velocity was within the normal range.    There was no evidence for stenosis. There was trivial    regurgitation.  - Right ventricle: The cavity size was normal. Wall thickness was    normal. Systolic function was normal.  - Tricuspid valve: There was no regurgitation.  - Pulmonary arteries: Systolic pressure was within the normal    range. PA peak pressure: 19 mm Hg (S).   Cardiac event monitor 09/08/2022    Predominant rhythm is normal sinus rhythm with normal circadian variation.   There are frequent episodes of  irregular atrial rhythm that are labeled as representing PACs.  Upon review, many of these actually are present second-degree sinoatrial block Mobitz type I, but they are not associated with severe bradycardia.   There is no evidence of true atrial fibrillation.   There are frequent episodes of very brief nonsustained atrial tachycardia (5-19 beats).   There are occasional PVCs.  There is no ventricular tachycardia.   Abnormal arrhythmia monitor due to the presence of sinoatrial block, second-degree Mobitz type I, frequent PACs, frequent brief episodes of nonsustained ectopic atrial tachycardia and occasional PVCs.   Atrial fibrillation is not seen and there is no evidence of severe bradycardia.     Patch Wear Time:  3 days and 7 hours (2023-11-22T09:15:18-0500 to 2023-11-25T17:10:22-0500)   Patient had a min HR of 46 bpm, max HR of 182 bpm, and avg HR of 70 bpm. Predominant underlying rhythm was Sinus Rhythm. 188 Supraventricular Tachycardia runs occurred, the run with the fastest interval lasting 5 beats with a max rate of 182 bpm, the  longest lasting 19 beats with an avg rate of 133 bpm. Supraventricular Tachycardia was detected within +/- 45 seconds of symptomatic patient event(s). Isolated SVEs were occasional (3.1%, 10387), SVE Couplets were occasional (2.1%, 3464), and SVE  Triplets were occasional (1.1%, 1190). Isolated VEs were rare (<1.0%), VE Couplets were rare (<1.0%), and no VE Triplets were present.      Assessment & Plan   1.  ***   Daniel Wong. Daniel Grillo NP-C     08/18/2023, 7:20 AM [facility] [address] Office [phone] Fax [phone]    I spent***minutes examining this patient, reviewing medications, and using patient centered shared decision making involving her cardiac care.   I spent greater than 20 minutes reviewing her past medical history,  medications, and prior cardiac tests.

## 2023-08-21 ENCOUNTER — Encounter: Payer: Self-pay | Admitting: General Practice

## 2023-08-21 ENCOUNTER — Ambulatory Visit: Payer: Medicare Other | Attending: General Practice | Admitting: General Practice

## 2023-08-21 VITALS — BP 122/70 | HR 53 | Ht 72.0 in | Wt 191.0 lb

## 2023-08-21 DIAGNOSIS — E7800 Pure hypercholesterolemia, unspecified: Secondary | ICD-10-CM | POA: Diagnosis present

## 2023-08-21 DIAGNOSIS — I455 Other specified heart block: Secondary | ICD-10-CM | POA: Insufficient documentation

## 2023-08-21 DIAGNOSIS — I249 Acute ischemic heart disease, unspecified: Secondary | ICD-10-CM | POA: Diagnosis not present

## 2023-08-21 NOTE — Patient Instructions (Signed)
Medication Instructions:  The current medical regimen is effective;  continue present plan and medications as directed. Please refer to the Current Medication list given to you today.  *If you need a refill on your cardiac medications before your next appointment, please call your pharmacy*  Lab Work: NONE  Other Instructions MAINTAIN PHYSICAL ACTIVITY PLEASE FOLLOW HEART HEALTHY DIET  Follow-Up: At [facility], you and your health needs are our priority.  As part of our continuing mission to provide you with exceptional heart care, we have created designated Provider Care Teams.  These Care Teams include your primary Cardiologist (physician) and Advanced Practice Providers (APPs -  Physician Assistants and Nurse Practitioners) who all work together to provide you with the care you need, when you need it.  Your next appointment:   12 month(s)  Provider:   Thurmon Fair, MD  or Edd Fabian, FNP       Heart-Healthy Eating Plan Eating a healthy diet is important for the health of your heart. A heart-healthy eating plan includes: Eating less unhealthy fats. Eating more healthy fats. Eating less salt in your food. Salt is also called sodium. Making other changes in your diet. Talk with your doctor or a diet specialist (dietitian) to create an eating plan that is right for you. What is my plan? What are tips for following this plan? Cooking Avoid frying your food. Try to bake, boil, grill, or broil it instead. You can also reduce fat by: Removing the skin from poultry. Removing all visible fats from meats. Steaming vegetables in water or broth. Meal planning  At meals, divide your plate into four equal parts: Fill one-half of your plate with vegetables and green salads. Fill one-fourth of your plate with whole grains. Fill one-fourth of your plate with lean protein foods. Eat 2-4 cups of vegetables per day. One cup of vegetables is: 1 cup (91 g) broccoli or  cauliflower florets. 2 medium carrots. 1 large bell pepper. 1 large sweet potato. 1 large tomato. 1 medium white potato. 2 cups (150 g) raw leafy greens. Eat 1-2 cups of fruit per day. One cup of fruit is: 1 small apple 1 large banana 1 cup (237 g) mixed fruit, 1 large orange,  cup (82 g) dried fruit, 1 cup (240 mL) 100% fruit juice. Eat more foods that have soluble fiber. These are apples, broccoli, carrots, beans, peas, and barley. Try to get 20-30 g of fiber per day. Eat 4-5 servings of nuts, legumes, and seeds per week: 1 serving of dried beans or legumes equals  cup (90 g) cooked. 1 serving of nuts is  oz (12 almonds, 24 pistachios, or 7 walnut halves). 1 serving of seeds equals  oz (8 g). General information Eat more home-cooked food. Eat less restaurant, buffet, and fast food. Limit or avoid alcohol. Limit foods that are high in starch and sugar. Avoid fried foods. Lose weight if you are overweight. Keep track of how much salt (sodium) you eat. This is important if you have high blood pressure. Ask your doctor to tell you more about this. Try to add vegetarian meals each week. Fats Choose healthy fats. These include olive oil and canola oil, flaxseeds, walnuts, almonds, and seeds. Eat more omega-3 fats. These include salmon, mackerel, sardines, tuna, flaxseed oil, and ground flaxseeds. Try to eat fish at least 2 times each week. Check food labels. Avoid foods with trans fats or high amounts of saturated fat. Limit saturated fats. These are often found in  animal products, such as meats, butter, and cream. These are also found in plant foods, such as palm oil, palm kernel oil, and coconut oil. Avoid foods with partially hydrogenated oils in them. These have trans fats. Examples are stick margarine, some tub margarines, cookies, crackers, and other baked goods. What foods should I eat? Fruits All fresh, canned (in natural juice), or frozen fruits. Vegetables Fresh or  frozen vegetables (raw, steamed, roasted, or grilled). Green salads. Grains Most grains. Choose whole wheat and whole grains most of the time. Rice and pasta, including brown rice and pastas made with whole wheat. Meats and other proteins Lean, well-trimmed beef, veal, pork, and lamb. Chicken and [place] without skin. All fish and shellfish. Wild duck, rabbit, pheasant, and venison. Egg whites or low-cholesterol egg substitutes. Dried beans, peas, lentils, and tofu. Seeds and most nuts. Dairy Low-fat or nonfat cheeses, including ricotta and mozzarella. Skim or 1% milk that is liquid, powdered, or evaporated. Buttermilk that is made with low-fat milk. Nonfat or low-fat yogurt. Fats and oils Non-hydrogenated (trans-free) margarines. Vegetable oils, including soybean, sesame, sunflower, olive, peanut, safflower, corn, canola, and cottonseed. Salad dressings or mayonnaise made with a vegetable oil. Beverages Mineral water. Coffee and tea. Diet carbonated beverages. Sweets and desserts Sherbet, gelatin, and fruit ice. Small amounts of dark chocolate. Limit all sweets and desserts. Seasonings and condiments All seasonings and condiments. The items listed above may not be a complete list of foods and drinks you can eat. Contact a dietitian for more options. What foods should I avoid? Fruits Canned fruit in heavy syrup. Fruit in cream or butter sauce. Fried fruit. Limit coconut. Vegetables Vegetables cooked in cheese, cream, or butter sauce. Fried vegetables. Grains Breads that are made with saturated or trans fats, oils, or whole milk. Croissants. Sweet rolls. Donuts. High-fat crackers, such as cheese crackers. Meats and other proteins Fatty meats, such as hot dogs, ribs, sausage, bacon, rib-eye roast or steak. High-fat deli meats, such as salami and bologna. Caviar. Domestic duck and goose. Organ meats, such as liver. Dairy Cream, sour cream, cream cheese, and creamed cottage cheese. Whole-milk  cheeses. Whole or 2% milk that is liquid, evaporated, or condensed. Whole buttermilk. Cream sauce or high-fat cheese sauce. Yogurt that is made from whole milk. Fats and oils Meat fat, or shortening. Cocoa butter, hydrogenated oils, palm oil, coconut oil, palm kernel oil. Solid fats and shortenings, including bacon fat, salt pork, lard, and butter. Nondairy cream substitutes. Salad dressings with cheese or sour cream. Beverages Regular sodas and juice drinks with added sugar. Sweets and desserts Frosting. Pudding. Cookies. Cakes. Pies. Milk chocolate or white chocolate. Buttered syrups. Full-fat ice cream or ice cream drinks. The items listed above may not be a complete list of foods and drinks to avoid. Contact a dietitian for more information. Summary Heart-healthy meal planning includes eating less unhealthy fats, eating more healthy fats, and making other changes in your diet. Eat a balanced diet. This includes fruits and vegetables, low-fat or nonfat dairy, lean protein, nuts and legumes, whole grains, and heart-healthy oils and fats. This information is not intended to replace advice given to you by your health care provider. Make sure you discuss any questions you have with your health care provider. Document Revised: 11/01/2021 Document Reviewed: 11/01/2021 Elsevier Patient Education  2024 [employer].

## 2024-07-03 ENCOUNTER — Ambulatory Visit (INDEPENDENT_AMBULATORY_CARE_PROVIDER_SITE_OTHER)

## 2024-07-03 ENCOUNTER — Ambulatory Visit (INDEPENDENT_AMBULATORY_CARE_PROVIDER_SITE_OTHER): Admitting: Podiatry

## 2024-07-03 ENCOUNTER — Encounter: Payer: Self-pay | Admitting: Podiatry

## 2024-07-03 VITALS — Ht 72.0 in | Wt 191.0 lb

## 2024-07-03 DIAGNOSIS — M21612 Bunion of left foot: Secondary | ICD-10-CM

## 2024-07-03 DIAGNOSIS — M2012 Hallux valgus (acquired), left foot: Secondary | ICD-10-CM

## 2024-07-03 DIAGNOSIS — M21611 Bunion of right foot: Secondary | ICD-10-CM | POA: Diagnosis not present

## 2024-07-03 DIAGNOSIS — M2011 Hallux valgus (acquired), right foot: Secondary | ICD-10-CM | POA: Diagnosis not present

## 2024-07-03 DIAGNOSIS — M21619 Bunion of unspecified foot: Secondary | ICD-10-CM

## 2024-07-03 NOTE — Progress Notes (Signed)
   Chief Complaint  Patient presents with   Bunions    Pt is here due to bilateral bunions, states no pain, but has notice how the great toes are turning towards the second toes and rubbing, believes it is changing his gait.    Subjective: 68 y.o. male presents today as a new patient for evaluation of minimally symptomatic bunions to the bilateral feet.  He has had them for several years.  He believes it is possibly altering his gait slightly.  Past Medical History:  Diagnosis Date   Allergic rhinitis    Arthritis    some in AC joints in my shoulders (05/02/2018)   Bronchial pneumonia ~ 1966   GERD (gastroesophageal reflux disease)    Gout    High cholesterol    History of gout    tx'd prn RX (05/02/2018)   Hypercalcemia    Hyperparathyroidism    Osteopenia    PSA elevation    Rosacea     Past Surgical History:  Procedure Laterality Date   CARDIAC CATHETERIZATION  05/02/2018   COLONOSCOPY W/ BIOPSIES AND POLYPECTOMY  2014   no issues   ESOPHAGOGASTRODUODENOSCOPY  ~ 2005   FINGER SURGERY     Due to gout   LEFT HEART CATH AND CORONARY ANGIOGRAPHY N/A 05/02/2018   Procedure: LEFT HEART CATH AND CORONARY ANGIOGRAPHY;  Surgeon: Burnard Debby LABOR, MD;  Location: [facility];  Service: Cardiovascular;  Laterality: N/A;   PARATHYROIDECTOMY N/A 03/21/2023   Procedure: LEFT PARATHYROIDECTOMY;  Surgeon: Eletha Boas, MD;  Location: [facility];  Service: General;  Laterality: N/A;   TONSILLECTOMY  1960s    Allergies  Allergen Reactions   Sulfa Antibiotics Hives     Objective: Physical Exam General: The patient is alert and oriented x3 in no acute distress.  Dermatology: Skin is cool, dry and supple bilateral lower extremities. Negative for open lesions or macerations.  Vascular: Palpable pedal pulses bilaterally. No edema or erythema noted. Capillary refill within normal limits.  Neurological: Grossly intact via light touch  Musculoskeletal Exam: Clinical evidence of  bunion deformity noted to the respective foot.  There is also some limited range of motion to the first MTP.  Lateral deviation of the great toe against the adjacent second digit  Radiographic Exam B/L feet 07/03/2024: Hallux valgus deformity noted bilateral with an increased intermetatarsal angle  Assessment: 1.  Hallux valgus bilateral   Plan of Care:  -Patient was evaluated. X-Rays reviewed. -For now recommend conservative treatment management.  Currently there are minimally symptomatic and manageable -Recommend wide fitting shoes that do not irritate or constrict the toebox area -Silicone toe spacers were utilized to wear PRN-return to clinic PRN    Thresa EMERSON Sar, DPM [facility]  Dr. Thresa EMERSON Sar, DPM    [address] [place], [place] [address]                Office [phone]  Fax [phone]

## 2024-10-14 ENCOUNTER — Ambulatory Visit: Attending: Cardiovascular Disease | Admitting: Cardiovascular Disease

## 2024-10-14 ENCOUNTER — Encounter: Payer: Self-pay | Admitting: Cardiovascular Disease

## 2024-10-14 VITALS — BP 112/60 | HR 71 | Ht 72.0 in | Wt 193.0 lb

## 2024-10-14 DIAGNOSIS — I491 Atrial premature depolarization: Secondary | ICD-10-CM | POA: Diagnosis not present

## 2024-10-14 DIAGNOSIS — I455 Other specified heart block: Secondary | ICD-10-CM | POA: Insufficient documentation

## 2024-10-14 DIAGNOSIS — E7800 Pure hypercholesterolemia, unspecified: Secondary | ICD-10-CM | POA: Diagnosis not present

## 2024-10-14 NOTE — Progress Notes (Signed)
 " Cardiology Office Note:    Date:  10/14/2024   ID:  Daniel Wong, Daniel Wong [birth year], MRN [id number]  PCP:  Loreli Kins, MD   [facility] Providers Cardiologist:  Jerel Balding, MD     Referring MD: Loreli Kins, MD   Chief Complaint  Patient presents with   Irregular Heart Beat     History of Present Illness:    Daniel Wong is a 69 y.o. male with a hx of hypercholesterolemia, minor coronary atherosclerosis (cardiac catheterization 2019), GERD, mild hypercalcemia, osteopenia, referred in consultation for heart block detected during a colonoscopy performed with propofol  sedation.  His first visit to the cardiology office since 2019.  He has no cardiovascular complaints.  He continues to walk 5 days a week, exercises with a trainer, without complaints of shortness of breath or chest pain.  Has occasional lightheadedness if he has been sitting down for long period of time and stands up too quickly but this has not changed.  He has not had any random episodes of dizziness and denies syncope.  He does not have lower extremity edema, orthopnea, PND, claudication, focal neurological complaints.  His biggest complaints are related to BPH and frequency and nocturia.  He is already taking tamsulosin (this does not appear to have worsened his orthostatic dizziness) and recently started treatment with tadalafil as a daily medication.  Frequency was a problem during a bus tour of [place] last year.  He is considering the possible need for an interventional procedure with his urologist, Dr. Adina Brooks.  He would like to get it taken care of before a planned bus trip through [place], scheduled next May.  Coronary calcium  score performed 2024 showed favorable findings with the calcium  score that places him in the 38th percentile for age and gender.  That same year he also had a vascular screen that showed no evidence of carotid plaque or abdominal aortic aneurysm and normal ABIs  In  2019 he had transient chest pain after eating breakfast very quickly.  His EKG was interpreted as possibly showing inferior STEMI.  He underwent coronary angiography which showed only minor plaque.  It was felt that his symptoms were due to esophageal spasm.     Past Medical History:  Diagnosis Date   Allergic rhinitis    Arthritis    some in AC joints in my shoulders (05/02/2018)   Bronchial pneumonia ~ 1966   GERD (gastroesophageal reflux disease)    Gout    High cholesterol    History of gout    tx'd prn RX (05/02/2018)   Hypercalcemia    Hyperparathyroidism    Osteopenia    PSA elevation    Rosacea     Past Surgical History:  Procedure Laterality Date   CARDIAC CATHETERIZATION  05/02/2018   COLONOSCOPY W/ BIOPSIES AND POLYPECTOMY  2014   no issues   ESOPHAGOGASTRODUODENOSCOPY  ~ 2005   FINGER SURGERY     Due to gout   LEFT HEART CATH AND CORONARY ANGIOGRAPHY N/A 05/02/2018   Procedure: LEFT HEART CATH AND CORONARY ANGIOGRAPHY;  Surgeon: Burnard Debby LABOR, MD;  Location: [facility];  Service: Cardiovascular;  Laterality: N/A;   PARATHYROIDECTOMY N/A 03/21/2023   Procedure: LEFT PARATHYROIDECTOMY;  Surgeon: Eletha Boas, MD;  Location: [facility];  Service: General;  Laterality: N/A;   TONSILLECTOMY  1960s    Current Medications: Current Meds  Medication Sig   aspirin  81 MG tablet Take 81 mg by mouth daily.  atorvastatin  (LIPITOR) 40 MG tablet Take 40 mg by mouth daily.   Azelaic Acid (FINACEA) 15 % cream Apply 15 % topically as needed. After skin is thoroughly washed and patted dry, gently but thoroughly massage a thin film of azelaic acid cream into the affected area twice daily, in the morning and evening.   colchicine  0.6 MG tablet Take 0.6 mg by mouth 2 (two) times daily as needed for pain (gout).   desonide (DESOWEN) 0.05 % cream Apply 1 application  topically daily as needed (rash in ears).   diazepam  (VALIUM ) 10 MG tablet Take 10-20 mg by mouth as needed  for anxiety (for flying).   doxylamine, Sleep, (UNISOM) 25 MG tablet Take 25 mg by mouth at bedtime.    fluticasone (FLONASE) 50 MCG/ACT nasal spray Place 2 sprays into both nostrils every morning.   guaiFENesin (MUCINEX) 600 MG 12 hr tablet Take 600 mg by mouth 2 (two) times daily as needed for cough or to loosen phlegm.   loratadine (CLARITIN) 10 MG tablet Take 10 mg by mouth daily.   Multiple Vitamins-Minerals (CENTRUM SILVER 50+MEN) TABS Take 2 tablets by mouth daily at 6 (six) AM.   naproxen sodium (ALEVE) 220 MG tablet Take 220 mg by mouth daily as needed.   Probiotic Product (PHILLIPS COLON HEALTH) CAPS Take 1 capsule by mouth daily in the afternoon.   pseudoephedrine (SUDAFED) 120 MG 12 hr tablet Take 120 mg by mouth every 12 (twelve) hours as needed for congestion.   RABEprazole (ACIPHEX) 20 MG tablet Take 20 mg by mouth daily.   sodium chloride  (OCEAN) 0.65 % SOLN nasal spray Place 1 spray into both nostrils as needed for congestion.   tadalafil (CIALIS) 5 MG tablet Take 5 mg by mouth every morning.   tamsulosin (FLOMAX) 0.4 MG CAPS capsule Take 0.4 mg by mouth at bedtime.     Allergies:   Sulfa antibiotics       Family History: The patient's family history includes Multiple myeloma in his mother; Prostate cancer in his father. There is no history of Hypercalcemia.  ROS:   Please see the history of present illness.     All other systems reviewed and are negative.  EKGs/Labs/Other Studies Reviewed:    The following studies were reviewed today: Cardiac catheterization 05/02/2018  Prox RCA to Mid RCA lesion is 25% stenosed.   Mild nonobstructive coronary artery disease with smooth 20 to 30% eccentric narrowing in the proximal to mid RCA and a dominant RCA system.   Normal left coronary system.   Normal LV function without focal segmental wall motion abnormalities.  LVEDP 11 mm.   RECOMMENDATION: Medical therapy.  The patient will undergo a 2D echo Doppler study to  assess for possible pericardial abnormality.  At the completion of the procedure the patient appeared comfortable but still had mild residual 3 out of 10 chest pain he describes seems to be worse with taking a deep breath.  A subsequent ECG showed improvement in his J-point elevation and was interpreted as possible early repolarization changes.   Recommend Aspirin  81mg  daily for moderate CAD.  The patient will continue with high potency statin therapy and antireflux therapy.  Echocardiogram 05/03/2018   - Left ventricle: The cavity size was normal. Systolic function was    normal. The estimated ejection fraction was in the range of 60%    to 65%. Wall motion was normal; there were no regional wall    motion abnormalities.  - Aortic valve: Transvalvular velocity  was within the normal range.    There was no stenosis. There was mild regurgitation. Valve area    (VTI): 3.07 cm^2. Valve area (Vmax): 2.83 cm^2. Valve area    (Vmean): 2.96 cm^2.  - Mitral valve: Transvalvular velocity was within the normal range.    There was no evidence for stenosis. There was trivial    regurgitation.  - Right ventricle: The cavity size was normal. Wall thickness was    normal. Systolic function was normal.  - Tricuspid valve: There was no regurgitation.  - Pulmonary arteries: Systolic pressure was within the normal    range. PA peak pressure: 19 mm Hg (S).    EKG:  EKG is  ordered today.  The ekg ordered today demonstrates NS rhythm with a single PAC, early repolarization pattern with J-point elevation most obvious in the inferior leads.  QTc 384 ms  Review of a brief telemetry strip from his colonoscopy shows grouped beating with progressive shortening of the P-P interval before there is a dropped P wave consistent with second-degree sinoatrial block Mobitz type I  Recent Labs: No results found for requested labs within last 365 days.  Labs from [place] physicians Calcium  11.4, creatinine 1.01, normal CBC,  normal TSH, normal vitamin D  and intact PTH levels Recent Lipid Panel    Component Value Date/Time   CHOL 154 05/02/2018 1622   TRIG 75 05/02/2018 1622   HDL 52 05/02/2018 1622   CHOLHDL 3.0 05/02/2018 1622   VLDL 15 05/02/2018 1622   LDLCALC 87 05/02/2018 1622  Labs from 06/17/2024 from [place] physicians Cholesterol 172, HDL 65, LDL 86, triglycerides 118   Risk Assessment/Calculations:                Physical Exam:    VS:  BP 112/60 (BP Location: Left Arm, Patient Position: Sitting, Cuff Size: Normal)   Pulse 71   Ht 6' (1.829 m)   Wt 193 lb (87.5 kg)   SpO2 97%   BMI 26.18 kg/m     Wt Readings from Last 3 Encounters:  10/14/24 193 lb (87.5 kg)  07/03/24 191 lb (86.6 kg)  08/21/23 191 lb (86.6 kg)     General: Alert, oriented x3, no distress, appears healthy and fit Head: no evidence of trauma, PERRL, EOMI, no exophtalmos or lid lag, no myxedema, no xanthelasma; normal ears, nose and oropharynx Neck: normal jugular venous pulsations and no hepatojugular reflux; brisk carotid pulses without delay and no carotid bruits Chest: clear to auscultation, no signs of consolidation by percussion or palpation, normal fremitus, symmetrical and full respiratory excursions Cardiovascular: normal position and quality of the apical impulse, regular rhythm, normal first and second heart sounds, no murmurs, rubs or gallops Abdomen: no tenderness or distention, no masses by palpation, no abnormal pulsatility or arterial bruits, normal bowel sounds, no hepatosplenomegaly Extremities: no clubbing, cyanosis or edema; 2+ radial, ulnar and brachial pulses bilaterally; 2+ right femoral, posterior tibial and dorsalis pedis pulses; 2+ left femoral, posterior tibial and dorsalis pedis pulses; no subclavian or femoral bruits Neurological: grossly nonfocal Psych: Normal mood and affect    ASSESSMENT:    1. SA block   2. Premature atrial contractions   3. Hypercholesterolemia   4.  Hypercalcemia    PLAN:    In order of problems listed above:  Second-degree sinoatrial block Mobitz type I: This might have been physiological due to high vagal tone and during sedation for colonoscopy period the monitor he wore in 2023 did not show  any pauses in excess of 3 seconds.  Today's ECG shows frequent PACs rather than any convincing evidence for sinoatrial block.  No specific treatment is recommended for these. Hypercholesterolemia: He does not have clinically evident CAD or PAD.  Coronary calcium  score in March 2024 was 61 at age 46 (8 percentile).  Target LDL cholesterol less than 899.  Continue atorvastatin  40 mg daily. Hypercalcemia: s/p parathyroidectomy June 2024.           Medication Adjustments/Labs and Tests Ordered: Current medicines are reviewed at length with the patient today.  Concerns regarding medicines are outlined above.  Orders Placed This Encounter  Procedures   EKG 12-Lead   No orders of the defined types were placed in this encounter.   Patient Instructions  Medication Instructions:   No changes *If you need a refill on your cardiac medications before your next appointment, please call your pharmacy*   Lab Work: Not needed    Testing/Procedures:  Not needed  Follow-Up: At [facility], you and your health needs are our priority.  As part of our continuing mission to provide you with exceptional heart care, we have created designated Provider Care Teams.  These Care Teams include your primary Cardiologist (physician) and Advanced Practice Providers (APPs -  Physician Assistants and Nurse Practitioners) who all work together to provide you with the care you need, when you need it.     Your next appointment:   12 month(s)  The format for your next appointment:   In Person  Provider:   Jerel Balding, MD   Signed, Jerel Balding, MD  10/14/2024 9:51 AM    [facility]  "

## 2024-10-14 NOTE — Patient Instructions (Signed)
Medication Instructions:   No changes  *If you need a refill on your cardiac medications before your next appointment, please call your pharmacy*   Lab Work: Not needed    Testing/Procedures:  Not  needed  Follow-Up: At [facility], you and your health needs are our priority.  As part of our continuing mission to provide you with exceptional heart care, we have created designated Provider Care Teams.  These Care Teams include your primary Cardiologist (physician) and Advanced Practice Providers (APPs -  Physician Assistants and Nurse Practitioners) who all work together to provide you with the care you need, when you need it.     Your next appointment:   12 month(s)  The format for your next appointment:   In Person  Provider:   Mihai Croitoru, MD
